# Patient Record
Sex: Male | Born: 1945 | Race: White | Hispanic: No | Marital: Married | Smoking: Never smoker
Health system: Southern US, Community
[De-identification: ages and names within clinical notes are randomized; demographics above are authoritative.]

## PROBLEM LIST (undated history)

## (undated) DIAGNOSIS — R918 Other nonspecific abnormal finding of lung field: Secondary | ICD-10-CM

## (undated) DIAGNOSIS — I2510 Atherosclerotic heart disease of native coronary artery without angina pectoris: Secondary | ICD-10-CM

## (undated) DIAGNOSIS — H9190 Unspecified hearing loss, unspecified ear: Secondary | ICD-10-CM

## (undated) DIAGNOSIS — K219 Gastro-esophageal reflux disease without esophagitis: Secondary | ICD-10-CM

## (undated) DIAGNOSIS — E785 Hyperlipidemia, unspecified: Secondary | ICD-10-CM

## (undated) DIAGNOSIS — M109 Gout, unspecified: Secondary | ICD-10-CM

## (undated) DIAGNOSIS — I219 Acute myocardial infarction, unspecified: Secondary | ICD-10-CM

## (undated) DIAGNOSIS — R42 Dizziness and giddiness: Secondary | ICD-10-CM

## (undated) DIAGNOSIS — I10 Essential (primary) hypertension: Secondary | ICD-10-CM

## (undated) HISTORY — PX: APPENDECTOMY: SHX54

## (undated) HISTORY — PX: OTHER SURGICAL HISTORY: SHX169

## (undated) HISTORY — PX: CHOLECYSTECTOMY: SHX55

## (undated) HISTORY — PX: CARDIAC CATHETERIZATION: SHX172

## (undated) HISTORY — PX: CORONARY ARTERY BYPASS GRAFT: SHX141

---

## 2005-01-15 ENCOUNTER — Encounter: Payer: Self-pay | Admitting: Internal Medicine

## 2005-01-24 ENCOUNTER — Encounter: Payer: Self-pay | Admitting: Internal Medicine

## 2005-05-24 ENCOUNTER — Emergency Department: Payer: Self-pay | Admitting: Emergency Medicine

## 2005-05-24 ENCOUNTER — Other Ambulatory Visit: Payer: Self-pay

## 2005-09-22 ENCOUNTER — Ambulatory Visit: Payer: Self-pay | Admitting: Gastroenterology

## 2006-01-13 ENCOUNTER — Ambulatory Visit: Payer: Self-pay | Admitting: Internal Medicine

## 2006-01-29 ENCOUNTER — Inpatient Hospital Stay (HOSPITAL_COMMUNITY): Admission: RE | Admit: 2006-01-29 | Discharge: 2006-01-30 | Payer: Self-pay | Admitting: Neurosurgery

## 2006-06-10 ENCOUNTER — Encounter: Payer: Self-pay | Admitting: Podiatry

## 2006-06-16 ENCOUNTER — Other Ambulatory Visit: Payer: Self-pay

## 2006-06-16 ENCOUNTER — Emergency Department: Payer: Self-pay | Admitting: Internal Medicine

## 2006-06-26 ENCOUNTER — Encounter: Payer: Self-pay | Admitting: Podiatry

## 2006-08-12 ENCOUNTER — Encounter: Payer: Self-pay | Admitting: Cardiology

## 2006-08-25 ENCOUNTER — Encounter: Payer: Self-pay | Admitting: Cardiology

## 2006-09-24 ENCOUNTER — Encounter: Payer: Self-pay | Admitting: Cardiology

## 2006-10-25 ENCOUNTER — Encounter: Payer: Self-pay | Admitting: Cardiology

## 2006-11-24 ENCOUNTER — Encounter: Payer: Self-pay | Admitting: Cardiology

## 2006-12-28 ENCOUNTER — Encounter: Payer: Self-pay | Admitting: Internal Medicine

## 2007-01-25 ENCOUNTER — Encounter: Payer: Self-pay | Admitting: Internal Medicine

## 2011-10-31 ENCOUNTER — Ambulatory Visit: Payer: Self-pay | Admitting: Gastroenterology

## 2014-02-07 ENCOUNTER — Emergency Department: Payer: Self-pay | Admitting: Emergency Medicine

## 2014-02-07 LAB — CBC WITH DIFFERENTIAL/PLATELET
BASOS ABS: 0.1 10*3/uL (ref 0.0–0.1)
BASOS PCT: 1.1 %
EOS ABS: 0.1 10*3/uL (ref 0.0–0.7)
Eosinophil %: 1 %
HCT: 51.4 % (ref 40.0–52.0)
HGB: 17.8 g/dL (ref 13.0–18.0)
Lymphocyte #: 2.1 10*3/uL (ref 1.0–3.6)
Lymphocyte %: 21.4 %
MCH: 29 pg (ref 26.0–34.0)
MCHC: 34.7 g/dL (ref 32.0–36.0)
MCV: 84 fL (ref 80–100)
Monocyte #: 1 x10 3/mm (ref 0.2–1.0)
Monocyte %: 10.5 %
NEUTROS PCT: 66 %
Neutrophil #: 6.5 10*3/uL (ref 1.4–6.5)
PLATELETS: 160 10*3/uL (ref 150–440)
RBC: 6.15 10*6/uL — AB (ref 4.40–5.90)
RDW: 14.3 % (ref 11.5–14.5)
WBC: 9.9 10*3/uL (ref 3.8–10.6)

## 2014-02-07 LAB — COMPREHENSIVE METABOLIC PANEL
ALBUMIN: 3.8 g/dL (ref 3.4–5.0)
ALK PHOS: 72 U/L
ALT: 29 U/L
ANION GAP: 12 (ref 7–16)
BILIRUBIN TOTAL: 1.1 mg/dL — AB (ref 0.2–1.0)
BUN: 13 mg/dL (ref 7–18)
CALCIUM: 8.8 mg/dL (ref 8.5–10.1)
CHLORIDE: 106 mmol/L (ref 98–107)
Co2: 21 mmol/L (ref 21–32)
Creatinine: 0.85 mg/dL (ref 0.60–1.30)
EGFR (African American): >60
EGFR (Non-African Amer.): >60
Glucose: 122 mg/dL — ABNORMAL HIGH (ref 65–99)
Osmolality: 279 (ref 275–301)
POTASSIUM: 3.4 mmol/L — AB (ref 3.5–5.1)
SGOT(AST): 34 U/L (ref 15–37)
Sodium: 139 mmol/L (ref 136–145)
Total Protein: 6.9 g/dL (ref 6.4–8.2)

## 2014-02-07 LAB — TROPONIN I: Troponin-I: <0.02 ng/mL

## 2014-08-25 ENCOUNTER — Ambulatory Visit
Admit: 2014-08-25 | Disposition: A | Discharge status: Home / Self Care | Payer: Self-pay | Attending: Internal Medicine | Admitting: Internal Medicine

## 2014-08-25 LAB — CBC CANCER CENTER
BASOS PCT: 1 %
Basophil #: 0.1 x10 3/mm (ref 0.0–0.1)
EOS PCT: 0.4 %
Eosinophil #: 0 x10 3/mm (ref 0.0–0.7)
HCT: 50.5 % (ref 40.0–52.0)
HGB: 17.8 g/dL (ref 13.0–18.0)
LYMPHS ABS: 1.3 x10 3/mm (ref 1.0–3.6)
Lymphocyte %: 13.7 %
MCH: 29.6 pg (ref 26.0–34.0)
MCHC: 35.2 g/dL (ref 32.0–36.0)
MCV: 84 fL (ref 80–100)
MONO ABS: 0.9 x10 3/mm (ref 0.2–1.0)
Monocyte %: 9.3 %
NEUTROS PCT: 75.6 %
Neutrophil #: 7 x10 3/mm — ABNORMAL HIGH (ref 1.4–6.5)
Platelet: 164 x10 3/mm (ref 150–440)
RBC: 6 10*6/uL — ABNORMAL HIGH (ref 4.40–5.90)
RDW: 14.6 % — AB (ref 11.5–14.5)
WBC: 9.2 x10 3/mm (ref 3.8–10.6)

## 2014-08-25 LAB — IRON AND TIBC
IRON SATURATION: 26.3
Iron Bind.Cap.(Total): 388 (ref 250–450)
Iron: 102 ug/dL
UNBOUND IRON-BIND. CAP.: 285.9

## 2014-08-25 LAB — FERRITIN: FERRITIN (ARMC): 99 ng/mL

## 2014-08-30 LAB — CBC CANCER CENTER
BASOS PCT: 0.9 %
Basophil #: 0.1 x10 3/mm (ref 0.0–0.1)
EOS PCT: 1.1 %
Eosinophil #: 0.1 x10 3/mm (ref 0.0–0.7)
HCT: 50.4 % (ref 40.0–52.0)
HGB: 17.5 g/dL (ref 13.0–18.0)
LYMPHS ABS: 1.4 x10 3/mm (ref 1.0–3.6)
Lymphocyte %: 17.5 %
MCH: 29.5 pg (ref 26.0–34.0)
MCHC: 34.7 g/dL (ref 32.0–36.0)
MCV: 85 fL (ref 80–100)
Monocyte #: 0.8 x10 3/mm (ref 0.2–1.0)
Monocyte %: 9.5 %
NEUTROS ABS: 5.6 x10 3/mm (ref 1.4–6.5)
NEUTROS PCT: 71 %
PLATELETS: 147 x10 3/mm — AB (ref 150–440)
RBC: 5.93 10*6/uL — ABNORMAL HIGH (ref 4.40–5.90)
RDW: 14.8 % — AB (ref 11.5–14.5)
WBC: 7.9 x10 3/mm (ref 3.8–10.6)

## 2015-01-08 ENCOUNTER — Other Ambulatory Visit: Payer: Self-pay | Admitting: Family Medicine

## 2015-01-09 ENCOUNTER — Telehealth: Payer: Self-pay | Admitting: *Deleted

## 2015-01-09 ENCOUNTER — Encounter: Payer: Self-pay | Admitting: *Deleted

## 2015-01-09 NOTE — Telephone Encounter (Signed)
Letter of release and Dr. Paula Compton progress note from 08/30/2014 faxed to Dr. Bethann Punches with [facility].Marland KitchenMarland Kitchen

## 2015-02-02 ENCOUNTER — Encounter: Payer: Self-pay | Admitting: *Deleted

## 2015-02-05 ENCOUNTER — Ambulatory Visit
Admission: RE | Admit: 2015-02-05 | Discharge: 2015-02-05 | Disposition: A | Discharge status: Home / Self Care | Payer: Medicare Other | Source: Ambulatory Visit | Attending: Gastroenterology | Admitting: Gastroenterology

## 2015-02-05 ENCOUNTER — Encounter
Admission: RE | Disposition: A | Discharge status: Home / Self Care | Payer: Self-pay | Source: Ambulatory Visit | Attending: Gastroenterology

## 2015-02-05 ENCOUNTER — Encounter: Payer: Self-pay | Admitting: *Deleted

## 2015-02-05 ENCOUNTER — Ambulatory Visit: Payer: Medicare Other | Admitting: Anesthesiology

## 2015-02-05 DIAGNOSIS — Z888 Allergy status to other drugs, medicaments and biological substances status: Secondary | ICD-10-CM | POA: Diagnosis not present

## 2015-02-05 DIAGNOSIS — K449 Diaphragmatic hernia without obstruction or gangrene: Secondary | ICD-10-CM | POA: Insufficient documentation

## 2015-02-05 DIAGNOSIS — Z7982 Long term (current) use of aspirin: Secondary | ICD-10-CM | POA: Diagnosis not present

## 2015-02-05 DIAGNOSIS — I252 Old myocardial infarction: Secondary | ICD-10-CM | POA: Diagnosis not present

## 2015-02-05 DIAGNOSIS — K210 Gastro-esophageal reflux disease with esophagitis: Secondary | ICD-10-CM | POA: Diagnosis not present

## 2015-02-05 DIAGNOSIS — K2960 Other gastritis without bleeding: Secondary | ICD-10-CM | POA: Diagnosis present

## 2015-02-05 DIAGNOSIS — F410 Panic disorder [episodic paroxysmal anxiety] without agoraphobia: Secondary | ICD-10-CM | POA: Insufficient documentation

## 2015-02-05 DIAGNOSIS — I10 Essential (primary) hypertension: Secondary | ICD-10-CM | POA: Diagnosis not present

## 2015-02-05 DIAGNOSIS — K317 Polyp of stomach and duodenum: Secondary | ICD-10-CM | POA: Insufficient documentation

## 2015-02-05 DIAGNOSIS — M109 Gout, unspecified: Secondary | ICD-10-CM | POA: Diagnosis not present

## 2015-02-05 DIAGNOSIS — E785 Hyperlipidemia, unspecified: Secondary | ICD-10-CM | POA: Diagnosis not present

## 2015-02-05 DIAGNOSIS — Z79899 Other long term (current) drug therapy: Secondary | ICD-10-CM | POA: Diagnosis not present

## 2015-02-05 DIAGNOSIS — R05 Cough: Secondary | ICD-10-CM | POA: Diagnosis not present

## 2015-02-05 DIAGNOSIS — I2510 Atherosclerotic heart disease of native coronary artery without angina pectoris: Secondary | ICD-10-CM | POA: Insufficient documentation

## 2015-02-05 HISTORY — DX: Atherosclerotic heart disease of native coronary artery without angina pectoris: I25.10

## 2015-02-05 HISTORY — DX: Gout, unspecified: M10.9

## 2015-02-05 HISTORY — DX: Hyperlipidemia, unspecified: E78.5

## 2015-02-05 HISTORY — DX: Other nonspecific abnormal finding of lung field: R91.8

## 2015-02-05 HISTORY — DX: Essential (primary) hypertension: I10

## 2015-02-05 HISTORY — PX: ESOPHAGOGASTRODUODENOSCOPY (EGD) WITH PROPOFOL: SHX5813

## 2015-02-05 HISTORY — DX: Acute myocardial infarction, unspecified: I21.9

## 2015-02-05 SURGERY — ESOPHAGOGASTRODUODENOSCOPY (EGD) WITH PROPOFOL
Anesthesia: General

## 2015-02-05 MED ORDER — PROPOFOL 10 MG/ML IV BOLUS
INTRAVENOUS | Status: DC | PRN
  Administered 2015-02-05: 70 mg via INTRAVENOUS
  Administered 2015-02-05: 30 mg via INTRAVENOUS

## 2015-02-05 MED ORDER — GLYCOPYRROLATE 0.2 MG/ML IJ SOLN
INTRAMUSCULAR | Status: DC | PRN
  Administered 2015-02-05: 0.2 mg via INTRAVENOUS

## 2015-02-05 MED ORDER — PROPOFOL INFUSION 10 MG/ML OPTIME
INTRAVENOUS | Status: DC | PRN
  Administered 2015-02-05: 120 ug/kg/min via INTRAVENOUS

## 2015-02-05 MED ORDER — BUTAMBEN-TETRACAINE-BENZOCAINE 2-2-14 % EX AERO
INHALATION_SPRAY | CUTANEOUS | Status: DC | PRN
  Administered 2015-02-05: 1 via TOPICAL

## 2015-02-05 MED ORDER — LIDOCAINE HCL (CARDIAC) 20 MG/ML IV SOLN
INTRAVENOUS | Status: DC | PRN
  Administered 2015-02-05: 50 mg via INTRAVENOUS

## 2015-02-05 MED ORDER — SODIUM CHLORIDE 0.9 % IV SOLN
INTRAVENOUS | Status: DC
Start: 2015-02-05 — End: 2015-02-05
  Administered 2015-02-05: 15:00:00 via INTRAVENOUS

## 2015-02-05 NOTE — H&P (Signed)
Outpatient short stay form Pre-procedure 02/05/2015 3:56 PM Bryan Deem MD  Primary Physician: Dr. Bethann Punches  Reason for visit:  GERD, EGD  History of present illness:  Patient is a 69 year old male presenting history gas after reflux. This seemed to got worse after having coughing related to the use of a Ace inhibitor. The coughing has improved since his Cipro was discontinued however he has continued to have some issues with reflux recently placed on a dose of 2 receptor antagonist in addition to his PPI. Patient has no dysphagia, there is some occasional dyspepsia. Patient also has a history of a nodule in the upper esophagus being removed on his last procedure. 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  Patient does take aspirin and Plavix however he had discontinued those 5 days ago.    Current facility-administered medications:  .  0.9 %  sodium chloride infusion, , Intravenous, Continuous, Bryan Deem, MD, Last Rate: 20 mL/hr at 02/05/15 1502  Prescriptions prior to admission  Medication Sig Dispense Refill Last Dose  . acetaminophen (TYLENOL) 500 MG tablet Take 500 mg by mouth every 6 (six) hours as needed.   Past Month at Unknown time  . ALPRAZolam (XANAX) 0.5 MG tablet Take 0.5 mg by mouth at bedtime as needed for anxiety.   Past Month at Unknown time  . amLODipine (NORVASC) 5 MG tablet Take 5 mg by mouth 2 (two) times daily.   02/05/2015 at 0600  . aspirin EC 81 MG tablet Take 81 mg by mouth daily.   Past Week at Unknown time  . clopidogrel (PLAVIX) 75 MG tablet Take 75 mg by mouth daily.   Past Week at Unknown time  . colchicine 0.6 MG tablet Take 0.6 mg by mouth 3 (three) times a week.   Past Week at Unknown time  . losartan (COZAAR) 100 MG tablet Take 100 mg by mouth daily.   02/05/2015 at 0600  . magnesium 30 MG tablet Take 30 mg by mouth 2 (two) times daily.   02/05/2015 at 0600  . metoprolol succinate (TOPROL-XL) 50  MG 24 hr tablet Take 50 mg by mouth daily. Take with or immediately following a meal.   02/05/2015 at 0600  . pantoprazole (PROTONIX) 40 MG tablet Take 40 mg by mouth daily.   02/04/2015 at Unknown time  . ranitidine (ZANTAC) 300 MG tablet Take 300 mg by mouth at bedtime.   02/04/2015 at Unknown time  . simvastatin (ZOCOR) 20 MG tablet Take 20 mg by mouth daily at 6 PM.   02/04/2015 at Unknown time  . nitroGLYCERIN (NITROSTAT) 0.4 MG SL tablet Place 0.4 mg under the tongue every 5 (five) minutes as needed for chest pain.   Not Taking at Unknown time  . vardenafil (LEVITRA) 10 MG tablet Take 10 mg by mouth as needed for erectile dysfunction.   Not Taking at Unknown time     Allergies  Allergen Reactions  . Ace Inhibitors   . Cardizem [Diltiazem Hcl]   . Prilosec Otc [Omeprazole Magnesium]   . Verapamil Hcl Er      Past Medical History  Diagnosis Date  . Coronary artery disease   . Myocardial infarction   . Gout   . Pulmonary nodules   . Hypertension   . Hyperlipidemia   . ASCVD (arteriosclerotic cardiovascular disease)     Review of systems:      Physical Exam    Heart and lungs: Regular rate  and rhythm without rub or gallop, lungs are bilaterally clear    HEENT: Normocephalic atraumatic eyes are anicteric    Other:     Pertinant exam for procedure: Soft nontender nondistended bowel sounds positive normoactive    Planned proceedures: EGD with indicated procedures. I have discussed the risks benefits and complications of procedures to include not limited to bleeding, infection, perforation and the risk of sedation and the patient wishes to proceed.    Bryan Deem, MD Gastroenterology 02/05/2015  3:56 PM

## 2015-02-05 NOTE — Anesthesia Preprocedure Evaluation (Addendum)
Anesthesia Evaluation  Patient identified by MRN, date of birth, ID band Patient awake    Reviewed: Allergy & Precautions, NPO status , Patient's Chart, lab work & pertinent test results, reviewed documented beta blocker date and time   Airway Mallampati: II  TM Distance: >3 FB     Dental  (+) Chipped   Pulmonary           Cardiovascular hypertension, Pt. on medications and Pt. on home beta blockers + CAD and + Past MI       Neuro/Psych    GI/Hepatic   Endo/Other    Renal/GU      Musculoskeletal   Abdominal   Peds  Hematology   Anesthesia Other Findings MI. CABG 2008. 1996 stent. Cervical fusion 2007. Stress echo 2 weeks ago normal. Has hx of panic attacks.  Reproductive/Obstetrics                            Anesthesia Physical Anesthesia Plan  ASA: III  Anesthesia Plan: General   Post-op Pain Management:    Induction:   Airway Management Planned: Nasal Cannula  Additional Equipment:   Intra-op Plan:   Post-operative Plan:   Informed Consent: I have reviewed the patients History and Physical, chart, labs and discussed the procedure including the risks, benefits and alternatives for the proposed anesthesia with the patient or authorized representative who has indicated his/her understanding and acceptance.     Plan Discussed with: CRNA  Anesthesia Plan Comments:         Anesthesia Quick Evaluation

## 2015-02-05 NOTE — Transfer of Care (Signed)
Immediate Anesthesia Transfer of Care Note  Patient: Bryan Leonard  Procedure(s) Performed: Procedure(s): ESOPHAGOGASTRODUODENOSCOPY (EGD) WITH PROPOFOL (N/A)  Patient Location: Endoscopy Unit  Anesthesia Type:General  Level of Consciousness: awake  Airway & Oxygen Therapy: Patient Spontanous Breathing and Patient connected to nasal cannula oxygen  Post-op Assessment: Report given to RN  Post vital signs: Reviewed  Last Vitals:  Filed Vitals:   02/05/15 1635  BP: 92/45  Pulse: 89  Temp: 36.1 C  Resp: 18    Complications: No apparent anesthesia complications

## 2015-02-05 NOTE — Op Note (Signed)
[facility] Gastroenterology Patient Name: Bryan Leonard Procedure Date: 02/05/2015 4:00 PM MRN: [id number] Account #: [id number] Date of Birth: [birth year] Admit Type: Outpatient Age: 69 Room: [facility] ENDO ROOM 3 Gender: Male Note Status: Finalized Procedure:         Upper GI endoscopy Indications:       Gastro-esophageal reflux disease, Esophageal reflux                     symptoms that persist despite appropriate therapy Providers:         Christena Deem, MD Referring MD:      Danella Penton, MD (Referring MD) Medicines:         Monitored Anesthesia Care Complications:     No immediate complications. Procedure:         Pre-Anesthesia Assessment:                    - ASA Grade Assessment: III - A patient with severe                     systemic disease.                    After obtaining informed consent, the endoscope was passed                     under direct vision. Throughout the procedure, the                     patient's blood pressure, pulse, and oxygen saturations                     were monitored continuously. The Olympus GIF-160 endoscope                     (S#. [id number]) was introduced through the mouth, and                     advanced to the third part of duodenum. The upper GI                     endoscopy was accomplished without difficulty. The patient                     tolerated the procedure. Findings:      The Z-line was irregular. Biopsies were taken with a cold forceps for       histology.      A small hiatus hernia was found. The Z-line was a variable distance from       incisors; the hiatal hernia was sliding.      LA Grade B (one or more mucosal breaks greater than 5 mm, not extending       between the tops of two mucosal folds) esophagitis with no bleeding was       found.      Scattered mild inflammation characterized by congestion (edema),       erosions and erythema was found on the greater curvature of the gastric   body. Biopsies were taken with a cold forceps for histology. Biopsies       were taken with a cold forceps for Helicobacter pylori testing.      The cardia and gastric fundus were normal on retroflexion otherwise.      The examined duodenum was normal.  Multiple 1 to 4 mm pedunculated and sessile polyps with no bleeding and       no stigmata of recent bleeding were found in the gastric body. Biopsies       were taken with a cold forceps for histology. Impression:        - Z-line irregular. Biopsied.                    - Small hiatus hernia.                    - LA Grade B erosive esophagitis.                    - Erosive gastritis. Biopsied.                    - Normal examined duodenum. Recommendation:    - Use Protonix (pantoprazole) 40 mg PO BID daily.                    - Await pathology results. Procedure Code(s): --- Professional ---                    [id number], Esophagogastroduodenoscopy, flexible, transoral;                     with biopsy, single or multiple Diagnosis Code(s): --- Professional ---                    530.89, Other specified disorders of esophagus                    530.19, Other esophagitis                    535.40, Other specified gastritis, without mention of                     hemorrhage                    530.81, Esophageal reflux                    553.3, Diaphragmatic hernia without mention of obstruction                     or gangrene CPT copyright 2014 American Medical Association. All rights reserved. The codes documented in this report are preliminary and upon coder review may  be revised to meet current compliance requirements. Christena Deem, MD 02/05/2015 4:34:39 PM This report has been signed electronically. Number of Addenda: 0 Note Initiated On: 02/05/2015 4:00 PM      [facility]

## 2015-02-06 ENCOUNTER — Encounter: Payer: Self-pay | Admitting: Gastroenterology

## 2015-02-07 LAB — SURGICAL PATHOLOGY

## 2015-02-09 NOTE — Anesthesia Postprocedure Evaluation (Signed)
  Anesthesia Post-op Note  Patient: Bryan Leonard  Procedure(s) Performed: Procedure(s): ESOPHAGOGASTRODUODENOSCOPY (EGD) WITH PROPOFOL (N/A)  Anesthesia type:General  Patient location: PACU  Post pain: Pain level controlled  Post assessment: Post-op Vital signs reviewed, Patient's Cardiovascular Status Stable, Respiratory Function Stable, Patent Airway and No signs of Nausea or vomiting  Post vital signs: Reviewed and stable  Last Vitals:  Filed Vitals:   02/05/15 1720  BP:   Pulse: 70  Temp:   Resp: 14    Level of consciousness: awake, alert  and patient cooperative  Complications: No apparent anesthesia complications

## 2015-04-24 ENCOUNTER — Ambulatory Visit: Payer: Medicare Other | Attending: Internal Medicine

## 2015-04-24 VITALS — BP 128/65 | HR 71

## 2015-04-24 DIAGNOSIS — M25551 Pain in right hip: Secondary | ICD-10-CM | POA: Diagnosis present

## 2015-04-24 DIAGNOSIS — R531 Weakness: Secondary | ICD-10-CM | POA: Insufficient documentation

## 2015-04-24 DIAGNOSIS — R262 Difficulty in walking, not elsewhere classified: Secondary | ICD-10-CM | POA: Diagnosis present

## 2015-04-24 NOTE — Therapy (Signed)
[place] [facility] [address] [place], [address] Phone: [phone]   Fax:  [phone]  Physical Therapy Evaluation  Patient Details  Name: Bryan Leonard MRN: [id number] Date of Birth: [birth year] Referring Provider: Bethann Punches, MD   Encounter Date: 04/24/2015      PT End of Session - 04/24/15 1437    Visit Number 1   Number of Visits 9   Date for PT Re-Evaluation 05/24/15   Authorization Type 1   Authorization Time Period of 10   PT Start Time 1435   PT Stop Time 1555   PT Time Calculation (min) 80 min      Past Medical History  Diagnosis Date  . Coronary artery disease   . Myocardial infarction (HCC)   . Gout   . Pulmonary nodules   . Hypertension   . Hyperlipidemia   . ASCVD (arteriosclerotic cardiovascular disease)     Past Surgical History  Procedure Laterality Date  . Appendectomy    . Cholecystectomy    . Coronary artery bypass graft    . Pseudoaneursym Right     right groin  . Cardiac catheterization    . Esophagogastroduodenoscopy (egd) with propofol N/A 02/05/2015    Procedure: ESOPHAGOGASTRODUODENOSCOPY (EGD) WITH PROPOFOL;  Surgeon: Christena Deem, MD;  Location[facility] ENDOSCOPY;  Service: Endoscopy;  Laterality: N/A;    Filed Vitals:   04/24/15 1446  BP: 128/65  Pulse: 71    Visit Diagnosis:  Right hip pain - Plan: PT plan of care cert/re-cert  Difficulty walking - Plan: PT plan of care cert/re-cert  Weakness - Plan: PT plan of care cert/re-cert      Subjective Assessment - 04/24/15 1446    Subjective R hip pain: 0/10 (pt currenlty sitting), 5/10 at most for the past 2 weeks (when pt was doing volunteer work at [employer])   Pertinent History R hip pain began June 2016 gradual onset. No falls, or known mechanism of injury. Pt states difficulty stepping up with his R LE to get up the steps due to pain and weakness. December 29, 2014 Dr. Hyacinth Meeker gave pt a cortisone shot for  his R hip which helped. Pt also adds prior to June 2016, pt had a cortisone shot in low back due to pain (around March or April 2016). Pt was diagnosed with bursitis in his R hip.  Had an X-ray for low back which revealed a little arthritis but nothing significant. 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 On 04/09/2015 did some clean up work for church which increased R hip pain causing pt to limp.  Pt states R hip and lateral thigh bruised. Hand and X-ray for his R hip which was negative for abnormality. Was prescrbed diclofenac for 5 days which helped the pain a little.   Pt states he did not feel an impact, or popping for his R hip.  Difficulty crossing his R leg over his left.    Patient Stated Goals "I'd like to make it (hip pain) go away. "   Currently in Pain? Yes   Pain Score --  please see subjective   Pain Location Hip   Pain Orientation Right   Pain Descriptors / Indicators Aching   Aggravating Factors  going up and down steps, donning socks   Pain Relieving Factors ice and heat   Multiple Pain Sites No  Objectives:   There-ex  Directed patient with prone glue max/quad set 10x2 with 5 second holds each LE (no pain) Supine hooklying hip adduction pillow squeeze 10x2 with 5 seconds (some R posterior hip tightness which decreased with less exercise effort)  Improved exercise technique, movement at target joints, use of target muscles after mod verbal, visual, tactile cues.           [facility] Assessment - 04/24/15 1452    Assessment   Medical Diagnosis  R hip pain   Referring Provider Bethann PunchesMark Miller, MD    Onset Date/Surgical Date 04/11/15  Date MD order signed   Prior Therapy No known physical therapy for current condition   Precautions   Precaution Comments No known precautions   Restrictions   Other Position/Activity Restrictions No known restrictions   Balance Screen   Has the patient  fallen in the past 6 months No   Has the patient had a decrease in activity level because of a fear of falling?  No   Is the patient reluctant to leave their home because of a fear of falling?  No   Prior Function   Vocation Retired   GafferVocation Requirements PLOF: better able to negotiate stairs, don and doff socks and shoes   Observation/Other Assessments   Observations Bruising R lateral thigh proximal half and brusing R posterior knee. Slump test revealed neural tension R hip sciatic nerve (part of pt hip pain). (+) Ober's test R LE. (-) S/L piriformis test R hip   Lower Extremity Functional Scale  73/80   Posture/Postural Control   Posture Comments R hip in ER, L lumbar side bend  L5-L1, R side bend from T12 and up, R iliac crest higher, R knee slightly higher, bilateral foot pronation, decreased lumbar lordosis.    AROM   Lumbar Flexion WFL with bilateral LE tightness   Lumbar Extension limited lumbar and thoracic extension   Lumbar - Right Side Bend Limited with R low back discomfort   Lumbar - Left Side Bend limited    Lumbar - Right Rotation WFL   Lumbar - Left Rotation WFL   Strength   Overall Strength Comments Reproduction or R lateral hip pain with eccentric hip abduction   Right Hip Flexion 4-/5  with R posterior hip stress   Right Hip Extension 4-/5   Right Hip External Rotation  4-/5   Right Hip Internal Rotation 4-/5  with slight R posterior hip discomfort   Right Hip ABduction 4/5  with reproduction of symptoms   Left Hip Flexion 4-/5   Left Hip Extension 4-/5   Left Hip External Rotation 4-/5   Left Hip Internal Rotation 4-/5   Left Hip ABduction 4/5   Right Knee Flexion 4/5   Right Knee Extension 5/5   Left Knee Flexion 4/5   Left Knee Extension 5/5   Palpation   Palpation comment TTP R lateral hip, proximal to bruise. TTP R anterior lateral greater trochanter.  TTP proximal attachement R hamstrings and proximal sciatic nerve.    Ambulation/Gait   Gait Comments  Antalgic, decreased stance R LE with R lateral lean     TTP Proximal attachement of R hamstrings with reprduction of symptoms especially with resisted knee flexion                      PT Education - 04/24/15 1912    Education provided Yes   Education Details ther-ex, ice, plan of care  Person(s) Educated Patient   Methods Explanation;Demonstration;Tactile cues;Verbal cues   Comprehension Verbalized understanding;Returned demonstration             PT Long Term Goals - May 21, 2015 1913    PT LONG TERM GOAL #1   Title Patient will have a decrease in R hip pain to 3/10 or less at worst to promote ability to ambulate, negotiate stairs, don and doff socks/shoes.   Time 4   Period Weeks   Status New   PT LONG TERM GOAL #2   Title Patient will improve bilateral hip abduction and glute max strength by at least 1/2 MMT grade to promote ability to ambulate with less pain in R hip.    Time 4   Period Weeks   Status New   PT LONG TERM GOAL #3   Title Patient will be able to ascend and descend 6 regular step with bilateral UE assist in a reciprocal pattern with 3/10 or less R hip pain to promote ability to enter and exit his home.    Time 4   Period Weeks   Status New               Plan - May 21, 2015 1906    Clinical Impression Statement Patient is a 69 year old male who came to physical therapy secondary to R hip pain. He also presents with bruising lateral thigh and posterior knee, TTP to R anterior lateral greater trochanter, posterior hip (proximal attachment of hamstrings and proximal sciatic nerve), neural tension, bilateral hip weakness, altered gait pattern and posture, and difficulty performing functional tasks such as negotiating stairs, donning and doffing socks/shoes, walking and maintaining positions such as laying on his R side. Patient will benefit from skilled physical therapy services to address the aforementioned deficits.    Pt will benefit from  skilled therapeutic intervention in order to improve on the following deficits Decreased strength;Pain;Difficulty walking   Rehab Potential Good   Clinical Impairments Affecting Rehab Potential age   PT Frequency 2x / week   PT Duration 4 weeks   PT Treatment/Interventions Therapeutic exercise;Manual techniques;Therapeutic activities;Iontophoresis /ml Dexamethasone;Electrical Stimulation;Patient/family education;Ultrasound;Neuromuscular re-education   PT Next Visit Plan gentle hip strengthenthening, manual therapy   Consulted and Agree with Plan of Care Patient          G-Codes - 05-21-2015 1919    Functional Assessment Tool Used LEFS, patient interview, clinical presentation, clinical judgement   Functional Limitation Mobility: Walking and moving around   Mobility: Walking and Moving Around Current Status (W0981) At least 20 percent but less than 40 percent impaired, limited or restricted   Mobility: Walking and Moving Around Goal Status [id number]) At least 1 percent but less than 20 percent impaired, limited or restricted       Problem List There are no active problems to display for this patient.  Thank you for your referral.   Loralyn Freshwater PT, DPT   May 21, 2015, 7:27 PM  [place] [place] [facility] PH[facility] SPORTS MEDICINE [address] [place], [address] Phone: [phone]   Fax:  [phone]  Name: LEM PEARY MRN: [id number] Date of Birth: Jul 01, 1945

## 2015-04-24 NOTE — Patient Instructions (Signed)
Patient was recommended to use ice to R hip x 15 min 5x daily to help decrease the bruising. Pt verbalized understanding.

## 2015-04-26 ENCOUNTER — Ambulatory Visit: Payer: Medicare Other | Attending: Internal Medicine

## 2015-04-26 DIAGNOSIS — R531 Weakness: Secondary | ICD-10-CM | POA: Diagnosis present

## 2015-04-26 DIAGNOSIS — R262 Difficulty in walking, not elsewhere classified: Secondary | ICD-10-CM | POA: Diagnosis present

## 2015-04-26 DIAGNOSIS — M25551 Pain in right hip: Secondary | ICD-10-CM | POA: Diagnosis not present

## 2015-04-26 NOTE — Therapy (Signed)
[facility]IC[facility] [address] [place], [address] Phone: [phone]   Fax:  [phone]  Physical Therapy Treatment  Patient Details  Name: Bryan Leonard MRN: [id number] Date of Birth: December 30, 1945 Referring Provider: Bethann Punches, MD   Encounter Date: 04/26/2015      PT End of Session - 04/26/15 0803    Visit Number 2   Number of Visits 9   Date for PT Re-Evaluation 05/24/15   Authorization Type 2   Authorization Time Period of 10   PT Start Time 0803   PT Stop Time 0856   PT Time Calculation (min) 53 min      Past Medical History  Diagnosis Date  . Coronary artery disease   . Myocardial infarction (HCC)   . Gout   . Pulmonary nodules   . Hypertension   . Hyperlipidemia   . ASCVD (arteriosclerotic cardiovascular disease)     Past Surgical History  Procedure Laterality Date  . Appendectomy    . Cholecystectomy    . Coronary artery bypass graft    . Pseudoaneursym Right     right groin  . Cardiac catheterization    . Esophagogastroduodenoscopy (egd) with propofol N/A 02/05/2015    Procedure: ESOPHAGOGASTRODUODENOSCOPY (EGD) WITH PROPOFOL;  Surgeon: Christena Deem, MD;  Location: [facility] ENDOSCOPY;  Service: Endoscopy;  Laterality: N/A;    There were no vitals filed for this visit.  Visit Diagnosis:  Right hip pain  Difficulty walking  Weakness      Subjective Assessment - 04/26/15 0804    Subjective Pt states that he has been icing his R hip, looks better. Still has some discomfort. 1/10 R hip discomfort currently.    Pertinent History R hip pain began June 2016 gradual onset. No falls, or known mechanism of injury. Pt states difficulty stepping up with his R LE to get up the steps due to pain and weakness. December 29, 2014 Dr. Hyacinth Meeker gave pt a cortisone shot for his R hip which helped. Pt also adds prior to June 2016, pt had a cortisone shot in low back due to pain (around March or April  2016). Pt was diagnosed with bursitis in his R hip.  Had an X-ray for low back which revealed a little arthritis but nothing significant. 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 On 04/09/2015 did some clean up work for church which increased R hip pain causing pt to limp.  Pt states R hip and lateral thigh bruised. Hand and X-ray for his R hip which was negative for abnormality. Was prescrbed diclofenac for 5 days which helped the pain a little.   Pt states he did not feel an impact, or popping for his R hip.  Difficulty crossing his R leg over his left.    Patient Stated Goals "I'd like to make it (hip pain) go away. "   Currently in Pain? Yes   Pain Score 1    Multiple Pain Sites No        Objectives:   There-ex  Directed patient with walking 32 ft x 2 with 5 lbs and 10 lbs weight on R side (to counterbalance R hip joint). No change in symptoms prone glue max/quad set 10x3 with 5 second holds each LE  Supine SLR hip flexion R 10x3 Supine hooklying hip adduction pillow squeeze 10x3 with 5 seconds Supine R knee  to chest with PT assist 30 seconds x 3 S/L R clam shell 10x3 Supine bilateral glute max extension isometrics (bridge position) 10x3 with 5 second holds Supine R hip extension (leg straight) isometrics 10x3 with 5 second holds Standing ball toss to trampoline firm surface 20x2, then standing on Air Ex pad 20x2 (to promote hip stability/control) Nustep (seat 8, level 4) x 5 min   Improved exercise technique, movement at target joints, use of target muscles after mod verbal, visual, tactile cues.     Posterior R hip pain (joint, sciatic area) in prone with ER. Decreased with increased repetition. Otherwise tolerated session well without aggravation of symptoms. Worked on gentle hip strengthening and stability exercises today to promote healing.                          PT Education  - 04/26/15 [phone]    Education provided Yes   Education Details ther-ex   Person(s) Educated Patient   Methods Explanation;Demonstration;Tactile cues;Verbal cues   Comprehension Verbalized understanding;Returned demonstration             PT Long Term Goals - 04/24/15 1913    PT LONG TERM GOAL #1   Title Patient will have a decrease in R hip pain to 3/10 or less at worst to promote ability to ambulate, negotiate stairs, don and doff socks/shoes.   Time 4   Period Weeks   Status New   PT LONG TERM GOAL #2   Title Patient will improve bilateral hip abduction and glute max strength by at least 1/2 MMT grade to promote ability to ambulate with less pain in R hip.    Time 4   Period Weeks   Status New   PT LONG TERM GOAL #3   Title Patient will be able to ascend and descend 6 regular step with bilateral UE assist in a reciprocal pattern with 3/10 or less R hip pain to promote ability to enter and exit his home.    Time 4   Period Weeks   Status New               Plan - 04/26/15 0802    Clinical Impression Statement Posterior R hip pain (joint, sciatic area) in prone with ER. Decreased with increased repetition. Otherwise tolerated session well without aggravation of symptoms. Worked on gentle hip strengthening and stability exercises today to promote healing   Pt will benefit from skilled therapeutic intervention in order to improve on the following deficits Decreased strength;Pain;Difficulty walking   Rehab Potential Good   Clinical Impairments Affecting Rehab Potential age   PT Frequency 2x / week   PT Duration 4 weeks   PT Treatment/Interventions Therapeutic exercise;Manual techniques;Therapeutic activities;Iontophoresis 4mg /ml Dexamethasone;Electrical Stimulation;Patient/family education;Ultrasound;Neuromuscular re-education   PT Next Visit Plan gentle hip strengthenthening, manual therapy   Consulted and Agree with Plan of Care Patient        Problem List There  are no active problems to display for this patient.  Loralyn FreshwaterMiguel Laygo PT, DPT   04/26/2015, 9:03 AM  [facility] [address] [place], [place], [address] Phone: [phone]   Fax:  [phone]  Name: Bryan Leonard MRN: [id number] Date of Birth: 03/25/1946

## 2015-04-26 NOTE — Patient Instructions (Addendum)
  On your back, keeping your right leg straight, and left knee bent: press your heel gently against the bed. Pain free level of effort. Hold for 5 seconds. Repeat 10 times, do 3 sets daily.

## 2015-05-01 ENCOUNTER — Ambulatory Visit: Payer: Medicare Other

## 2015-05-01 DIAGNOSIS — M25551 Pain in right hip: Secondary | ICD-10-CM | POA: Diagnosis not present

## 2015-05-01 DIAGNOSIS — R262 Difficulty in walking, not elsewhere classified: Secondary | ICD-10-CM

## 2015-05-01 DIAGNOSIS — R531 Weakness: Secondary | ICD-10-CM

## 2015-05-01 NOTE — Patient Instructions (Signed)
   Quadriceps Set (Prone)   With toes supporting lower legs, squeeze rear end muscles and tighten thigh muscles to straighten knees. Hold _5___ seconds. Relax. Repeat __10__ times per set. Do ___3_ sets per session. Do ___1_ sessions per day.  http://orth.exer.us/726   Copyright  VHI. All rights reserved.   

## 2015-05-01 NOTE — Therapy (Signed)
[facility] [address] [place], [address] Phone: [phone]   Fax:  [phone]  Physical Therapy Treatment  Patient Details  Name: Bryan Leonard MRN: [id number] Date of Birth: 1946-02-24 Referring Provider: Bethann Punches, MD   Encounter Date: 05/01/2015      PT End of Session - 05/01/15 1351    Visit Number 3   Number of Visits 9   Date for PT Re-Evaluation 05/24/15   Authorization Type 3   Authorization Time Period of 10   PT Start Time 1351   PT Stop Time 1439   PT Time Calculation (min) 48 min   Activity Tolerance Patient tolerated treatment well   Behavior During Therapy [facility] for tasks assessed/performed      Past Medical History  Diagnosis Date  . Coronary artery disease   . Myocardial infarction (HCC)   . Gout   . Pulmonary nodules   . Hypertension   . Hyperlipidemia   . ASCVD (arteriosclerotic cardiovascular disease)     Past Surgical History  Procedure Laterality Date  . Appendectomy    . Cholecystectomy    . Coronary artery bypass graft    . Pseudoaneursym Right     right groin  . Cardiac catheterization    . Esophagogastroduodenoscopy (egd) with propofol N/A 02/05/2015    Procedure: ESOPHAGOGASTRODUODENOSCOPY (EGD) WITH PROPOFOL;  Surgeon: Christena Deem, MD;  Location: [facility] ENDOSCOPY;  Service: Endoscopy;  Laterality: N/A;    There were no vitals filed for this visit.  Visit Diagnosis:  Right hip pain  Difficulty walking  Weakness      Subjective Assessment - 05/01/15 1352    Subjective Walking is easier. Getting up from a chair still bothers the back of his R hip. Still has difficulty lying on his R side without discomfort. Going up steps, R hip still feels weak.    Pertinent History R hip pain began June 2016 gradual onset. No falls, or known mechanism of injury. Pt states difficulty stepping up with his R LE to get up the steps due to pain and weakness. December 29, 2014 Dr. Hyacinth Meeker gave pt a cortisone shot for his R hip which helped. Pt also adds prior to June 2016, pt had a cortisone shot in low back due to pain (around March or April 2016). Pt was diagnosed with bursitis in his R hip.  Had an X-ray for low back which revealed a little arthritis but nothing significant. 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 On 04/09/2015 did some clean up work for church which increased R hip pain causing pt to limp.  Pt states R hip and lateral thigh bruised. Hand and X-ray for his R hip which was negative for abnormality. Was prescrbed diclofenac for 5 days which helped the pain a little.   Pt states he did not feel an impact, or popping for his R hip.  Difficulty crossing his R leg over his left.    Patient Stated Goals "I'd like to make it (hip pain) go away. "   Currently in Pain? Yes   Pain Score 2   getting up from chair; walking is 0-2/10   Multiple Pain Sites No         Objectives:   There-ex  Directed patient with Supine SLR hip flexion R 10x3, Supine hooklying hip adduction pillow squeeze 10x3 with 5 seconds  Supine bilateral glute max extension isometrics (bridge position) 10x3 with 5 second holds S/L R clam shell 10x3 (deep ache R hip felt afterwards) Supine R glute stretch (R knee to chest) 30 seconds x 5, prone glue max/quad set 10x3 with 5 second holds each LE  Total [employer] Squats incline 16 for 10x3,  Standing bilateral ankle DF/PF 2 min with bilateral UE assist,  Standing 2 kg ball toss to trampoline standing on Air Ex pad 20x3 (to promote hip stability/control) Forward step up with R LE onto AirEx pad with L UE assist 10x2 (slight discomfort R hip) Nustep (seat 8, level 4) x 5 min at end of session   Improved exercise technique, movement at target joints, use of target muscles after mod verbal, visual, tactile cues.    Pt tolerated session without aggravation of  symptoms. Slight R posterior hip discomfort with S/L R clam shell and forward step up onto Air Ex pad with R LE exercises which eased with rest.                               PT Education - 05/01/15 1359    Education provided Yes   Education Details ther-ex   Starwood HotelsPerson(s) Educated Patient   Methods Explanation;Demonstration;Tactile cues;Verbal cues   Comprehension Verbalized understanding;Returned demonstration             PT Long Term Goals - 04/24/15 1913    PT LONG TERM GOAL #1   Title Patient will have a decrease in R hip pain to 3/10 or less at worst to promote ability to ambulate, negotiate stairs, don and doff socks/shoes.   Time 4   Period Weeks   Status New   PT LONG TERM GOAL #2   Title Patient will improve bilateral hip abduction and glute max strength by at least 1/2 MMT grade to promote ability to ambulate with less pain in R hip.    Time 4   Period Weeks   Status New   PT LONG TERM GOAL #3   Title Patient will be able to ascend and descend 6 regular step with bilateral UE assist in a reciprocal pattern with 3/10 or less R hip pain to promote ability to enter and exit his home.    Time 4   Period Weeks   Status New               Plan - 05/01/15 1400    Clinical Impression Statement Pt tolerated session without aggravation of symptoms. Slight R posterior hip discomfort with S/L R clam shell and forward step up onto Air Ex pad with R LE exercises which eased with rest.    Pt will benefit from skilled therapeutic intervention in order to improve on the following deficits Decreased strength;Pain;Difficulty walking   Rehab Potential Good   Clinical Impairments Affecting Rehab Potential age   PT Frequency 2x / week   PT Duration 4 weeks   PT Treatment/Interventions Therapeutic exercise;Manual techniques;Therapeutic activities;Iontophoresis 4mg /ml Dexamethasone;Electrical Stimulation;Patient/family education;Ultrasound;Neuromuscular  re-education   PT Next Visit Plan gentle hip strengthenthening, manual therapy   Consulted and Agree with Plan of Care Patient        Problem List There are no active problems to display for this patient.   Loralyn FreshwaterMiguel Lesia Monica PT, DPT   05/01/2015, 2:44 PM  [facility] [address] [place], [place], [address] Phone: [phone]  Fax:  [phone]  Name: Bryan Leonard MRN: [id number] Date of Birth: 1945-06-10

## 2015-05-03 ENCOUNTER — Ambulatory Visit: Payer: Medicare Other

## 2015-05-03 DIAGNOSIS — M25551 Pain in right hip: Secondary | ICD-10-CM | POA: Diagnosis not present

## 2015-05-03 DIAGNOSIS — R262 Difficulty in walking, not elsewhere classified: Secondary | ICD-10-CM

## 2015-05-03 DIAGNOSIS — R531 Weakness: Secondary | ICD-10-CM

## 2015-05-03 NOTE — Therapy (Signed)
[facility] [address] [place], [place], [address] Phone: [phone]   Fax:  [phone]  Physical Therapy Treatment  Patient Details  Name: Bryan Leonard MRN: [id number] Date of Birth: 05/01/1946 Referring Provider: Bethann PunchesMark Miller, MD   Encounter Date: 05/03/2015      PT End of Session - 05/03/15 0814    Visit Number 4   Number of Visits 9   Date for PT Re-Evaluation 05/24/15   Authorization Type 4   Authorization Time Period of 10   PT Start Time 0815   PT Stop Time 0902   PT Time Calculation (min) 47 min   Activity Tolerance Patient tolerated treatment well   Behavior During Therapy [facility] for tasks assessed/performed      Past Medical History  Diagnosis Date  . Coronary artery disease   . Myocardial infarction (HCC)   . Gout   . Pulmonary nodules   . Hypertension   . Hyperlipidemia   . ASCVD (arteriosclerotic cardiovascular disease)     Past Surgical History  Procedure Laterality Date  . Appendectomy    . Cholecystectomy    . Coronary artery bypass graft    . Pseudoaneursym Right     right groin  . Cardiac catheterization    . Esophagogastroduodenoscopy (egd) with propofol N/A 02/05/2015    Procedure: ESOPHAGOGASTRODUODENOSCOPY (EGD) WITH PROPOFOL;  Surgeon: Christena DeemMartin U Skulskie, MD;  [facility];  Service: Endoscopy;  Laterality: N/A;    There were no vitals filed for this visit.  Visit Diagnosis:  Right hip pain  Difficulty walking  Weakness      Subjective Assessment - 05/03/15 0817    Subjective Overall better but still has some deep ache. Getting out of the car, bothers it. Getting up and walking helps.   Pertinent History R hip pain began June 2016 gradual onset. No falls, or known mechanism of injury. Pt states difficulty stepping up with his R LE to get up the steps due to pain and weakness. December 29, 2014 Dr. Hyacinth MeekerMiller gave pt a cortisone shot for his R hip which helped.  Pt also adds prior to June 2016, pt had a cortisone shot in low back due to pain (around March or April 2016). Pt was diagnosed with bursitis in his R hip.  Had an X-ray for low back which revealed a little arthritis but nothing significant. 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 On 04/09/2015 did some clean up work for church which increased R hip pain causing pt to limp.  Pt states R hip and lateral thigh bruised. Hand and X-ray for his R hip which was negative for abnormality. Was prescrbed diclofenac for 5 days which helped the pain a little.   Pt states he did not feel an impact, or popping for his R hip.  Difficulty crossing his R leg over his left.    Patient Stated Goals "I'd like to make it (hip pain) go away. "   Currently in Pain? Yes   Multiple Pain Sites No       Objectives:     There-ex:   Long sit test suggests anterior nutation or R innominate Directed patient with seated R hip ER 10x5 seconds (R hip ache and R thigh discomfort along L3 dermatome) Seated hip adduction pillow squeeze 10x3 with 5 second holds,  Supine R hip extension isometrics (leg straight) 10x10  second holds,  Supine SLR L hip flexion 10x3 Supine bilateral glute max extension isometrics (bridge position) 10x3 with 5 second holds Supine R glute stretch 10x10 seconds for 2 sets Standing bilateral shoulder extension resisting red band 10x2 with 5 second holds (to activate trunk muscles) Side stepping 32 ft x 2 to the R and L Standing 2 kg ball toss to trampoline with L foot on bosu 20x2 throws Standing ankle DF/PF on rocker board 2 min for tissue mobility   Improved exercise technique, movement at target joints, use of target muscles after mod verbal, visual, tactile cues.     No more bruising behind R knee. Per pt, bruising R hip and lateral thigh is gone. Tolerated session well without aggravation of symptoms. Per pt  description, R hip symptoms with R hip adduction and IR.                  PT Education - 05/03/15 [phone]    Education provided Yes   Education Details ther-ex   Person(s) Educated Patient   Methods Explanation;Demonstration;Tactile cues;Verbal cues   Comprehension Verbalized understanding;Returned demonstration             PT Long Term Goals - 04/24/15 1913    PT LONG TERM GOAL #1   Title Patient will have a decrease in R hip pain to 3/10 or less at worst to promote ability to ambulate, negotiate stairs, don and doff socks/shoes.   Time 4   Period Weeks   Status New   PT LONG TERM GOAL #2   Title Patient will improve bilateral hip abduction and glute max strength by at least 1/2 MMT grade to promote ability to ambulate with less pain in R hip.    Time 4   Period Weeks   Status New   PT LONG TERM GOAL #3   Title Patient will be able to ascend and descend 6 regular step with bilateral UE assist in a reciprocal pattern with 3/10 or less R hip pain to promote ability to enter and exit his home.    Time 4   Period Weeks   Status New               Plan - 05/03/15 0813    Clinical Impression Statement No more bruising behind R knee. Per pt, bruising R hip and lateral thigh is gone. Tolerated session well without aggravation of symptoms. Per pt description, R hip symptoms with R hip adduction and IR.    Pt will benefit from skilled therapeutic intervention in order to improve on the following deficits Decreased strength;Pain;Difficulty walking   Rehab Potential Good   Clinical Impairments Affecting Rehab Potential age   PT Frequency 2x / week   PT Duration 4 weeks   PT Treatment/Interventions Therapeutic exercise;Manual techniques;Therapeutic activities;Iontophoresis /ml Dexamethasone;Electrical Stimulation;Patient/family education;Ultrasound;Neuromuscular re-education   PT Next Visit Plan gentle hip strengthenthening, manual therapy   Consulted and Agree with  Plan of Care Patient        Problem List There are no active problems to display for this patient.  Loralyn Freshwater PT, DPT  05/03/2015, 10:28 AM  [facility] PHYSICAL AND SPORTS MEDICINE [address] [place], [address] Phone: [phone]   Fax:  [phone]  Name: Bryan Leonard MRN: [id number] Date of Birth: 1946-02-16

## 2015-05-10 ENCOUNTER — Ambulatory Visit: Payer: Medicare Other

## 2015-05-14 ENCOUNTER — Ambulatory Visit: Payer: Medicare Other

## 2015-06-08 ENCOUNTER — Other Ambulatory Visit: Payer: Self-pay | Admitting: Physician Assistant

## 2015-06-08 DIAGNOSIS — G5701 Lesion of sciatic nerve, right lower limb: Secondary | ICD-10-CM

## 2015-06-29 ENCOUNTER — Ambulatory Visit: Payer: Medicare Other

## 2015-08-24 ENCOUNTER — Emergency Department
Admission: EM | Admit: 2015-08-24 | Discharge: 2015-08-24 | Disposition: A | Discharge status: Home / Self Care | Payer: Medicare Other | Attending: Emergency Medicine | Admitting: Emergency Medicine

## 2015-08-24 ENCOUNTER — Encounter: Payer: Self-pay | Admitting: Emergency Medicine

## 2015-08-24 ENCOUNTER — Emergency Department: Payer: Medicare Other

## 2015-08-24 DIAGNOSIS — I10 Essential (primary) hypertension: Secondary | ICD-10-CM | POA: Insufficient documentation

## 2015-08-24 DIAGNOSIS — F419 Anxiety disorder, unspecified: Secondary | ICD-10-CM | POA: Insufficient documentation

## 2015-08-24 DIAGNOSIS — R002 Palpitations: Secondary | ICD-10-CM | POA: Diagnosis present

## 2015-08-24 DIAGNOSIS — Z951 Presence of aortocoronary bypass graft: Secondary | ICD-10-CM | POA: Insufficient documentation

## 2015-08-24 DIAGNOSIS — I252 Old myocardial infarction: Secondary | ICD-10-CM | POA: Diagnosis not present

## 2015-08-24 DIAGNOSIS — Z7982 Long term (current) use of aspirin: Secondary | ICD-10-CM | POA: Diagnosis not present

## 2015-08-24 DIAGNOSIS — Z79899 Other long term (current) drug therapy: Secondary | ICD-10-CM | POA: Diagnosis not present

## 2015-08-24 DIAGNOSIS — I2510 Atherosclerotic heart disease of native coronary artery without angina pectoris: Secondary | ICD-10-CM | POA: Insufficient documentation

## 2015-08-24 DIAGNOSIS — E785 Hyperlipidemia, unspecified: Secondary | ICD-10-CM | POA: Insufficient documentation

## 2015-08-24 LAB — CBC WITH DIFFERENTIAL/PLATELET
BASOS ABS: 0 10*3/uL (ref 0–0.1)
Basophils Relative: 1 %
EOS PCT: 1 %
Eosinophils Absolute: 0.1 10*3/uL (ref 0–0.7)
HCT: 49.3 % (ref 40.0–52.0)
HEMOGLOBIN: 17.3 g/dL (ref 13.0–18.0)
LYMPHS ABS: 1.7 10*3/uL (ref 1.0–3.6)
LYMPHS PCT: 26 %
MCH: 29.2 pg (ref 26.0–34.0)
MCHC: 35.1 g/dL (ref 32.0–36.0)
MCV: 83.1 fL (ref 80.0–100.0)
Monocytes Absolute: 0.6 10*3/uL (ref 0.2–1.0)
Monocytes Relative: 10 %
NEUTROS PCT: 62 %
Neutro Abs: 4.2 10*3/uL (ref 1.4–6.5)
PLATELETS: 129 10*3/uL — AB (ref 150–440)
RBC: 5.93 MIL/uL — AB (ref 4.40–5.90)
RDW: 15.3 % — ABNORMAL HIGH (ref 11.5–14.5)
WBC: 6.7 10*3/uL (ref 3.8–10.6)

## 2015-08-24 LAB — COMPREHENSIVE METABOLIC PANEL
ALK PHOS: 66 U/L (ref 38–126)
ALT: 24 U/L (ref 17–63)
AST: 28 U/L (ref 15–41)
Albumin: 4.2 g/dL (ref 3.5–5.0)
Anion gap: 5 (ref 5–15)
BUN: 14 mg/dL (ref 6–20)
CALCIUM: 9.4 mg/dL (ref 8.9–10.3)
CHLORIDE: 106 mmol/L (ref 101–111)
CO2: 22 mmol/L (ref 22–32)
CREATININE: 0.78 mg/dL (ref 0.61–1.24)
GFR calc Af Amer: >60 mL/min (ref >60)
GFR calc non Af Amer: >60 mL/min (ref >60)
Glucose, Bld: 128 mg/dL — ABNORMAL HIGH (ref 65–99)
Potassium: 3.3 mmol/L — ABNORMAL LOW (ref 3.5–5.1)
SODIUM: 133 mmol/L — AB (ref 135–145)
Total Bilirubin: 1.3 mg/dL — ABNORMAL HIGH (ref 0.3–1.2)
Total Protein: 6.8 g/dL (ref 6.5–8.1)

## 2015-08-24 LAB — TSH: TSH: 3.222 u[IU]/mL (ref 0.350–4.500)

## 2015-08-24 LAB — TROPONIN I
Troponin I: <0.03 ng/mL (ref <0.031)
Troponin I: <0.03 ng/mL (ref <0.031)

## 2015-08-24 MED ORDER — LORAZEPAM 2 MG/ML IJ SOLN
1.0000 mg | Freq: Once | INTRAMUSCULAR | Status: AC
  Administered 2015-08-24: 1 mg via INTRAVENOUS
  Filled 2015-08-24: qty 1

## 2015-08-24 NOTE — ED Notes (Signed)
Reports palpatations.  Skin w/d with good color.  Denies cp.

## 2015-08-24 NOTE — Discharge Instructions (Signed)
Palpitations A palpitation is the feeling that your heartbeat is irregular or is faster than normal. It may feel like your heart is fluttering or skipping a beat. Palpitations are usually not a serious problem. However, in some cases, you may need further medical evaluation. CAUSES  Palpitations can be caused by:  Smoking.  Caffeine or other stimulants, such as diet pills or energy drinks.  Alcohol.  Stress and anxiety.  Strenuous physical activity.  Fatigue.  Certain medicines.  Heart disease, especially if you have a history of irregular heart rhythms (arrhythmias), such as atrial fibrillation, atrial flutter, or supraventricular tachycardia.  An improperly working pacemaker or defibrillator. DIAGNOSIS  To find the cause of your palpitations, your health care provider will take your medical history and perform a physical exam. Your health care provider may also have you take a test called an ambulatory electrocardiogram (ECG). An ECG records your heartbeat patterns over a 24-hour period. You may also have other tests, such as:  Transthoracic echocardiogram (TTE). During echocardiography, sound waves are used to evaluate how blood flows through your heart.  Transesophageal echocardiogram (TEE).  Cardiac monitoring. This allows your health care provider to monitor your heart rate and rhythm in real time.  Holter monitor. This is a portable device that records your heartbeat and can help diagnose heart arrhythmias. It allows your health care provider to track your heart activity for several days, if needed.  Stress tests by exercise or by giving medicine that makes the heart beat faster. TREATMENT  Treatment of palpitations depends on the cause of your symptoms and can vary greatly. Most cases of palpitations do not require any treatment other than time, relaxation, and monitoring your symptoms. Other causes, such as atrial fibrillation, atrial flutter, or supraventricular  tachycardia, usually require further treatment. HOME CARE INSTRUCTIONS   Avoid:  Caffeinated coffee, tea, soft drinks, diet pills, and energy drinks.  Chocolate.  Alcohol.  Stop smoking if you smoke.  Reduce your stress and anxiety. Things that can help you relax include:  A method of controlling things in your body, such as your heartbeats, with your mind (biofeedback).  Yoga.  Meditation.  Physical activity such as swimming, jogging, or walking.  Get plenty of rest and sleep. SEEK MEDICAL CARE IF:   You continue to have a fast or irregular heartbeat beyond 24 hours.  Your palpitations occur more often. SEEK IMMEDIATE MEDICAL CARE IF:  You have chest pain or shortness of breath.  You have a severe headache.  You feel dizzy or you faint. MAKE SURE YOU:  Understand these instructions.  Will watch your condition.  Will get help right away if you are not doing well or get worse.   This information is not intended to replace advice given to you by your health care provider. Make sure you discuss any questions you have with your health care provider.   Document Released: 05/09/2000 Document Revised: 05/17/2013 Document Reviewed: 07/11/2011 Elsevier Interactive Patient Education 2016 Elsevier Inc[employer]  Panic Attacks Panic attacks are sudden, short-livedsurges of severe anxiety, fear, or discomfort. They may occur for no reason when you are relaxed, when you are anxious, or when you are sleeping. Panic attacks may occur for a number of reasons:   Healthy people occasionally have panic attacks in extreme, life-threatening situations, such as war or natural disasters. Normal anxiety is a protective mechanism of the body that helps us react to danger (fight or flight response).  Panic attacks are often seen with anxiety disorders, such  as panic disorder, social anxiety disorder, generalized anxiety disorder, and phobias. Anxiety disorders cause excessive or uncontrollable  anxiety. They may interfere with your relationships or other life activities. °· Panic attacks are sometimes seen with other mental illnesses, such as depression and posttraumatic stress disorder. °· Certain medical conditions, prescription medicines, and drugs of abuse can cause panic attacks. °SYMPTOMS  °Panic attacks start suddenly, peak within 20 minutes, and are accompanied by four or more of the following symptoms: °· Pounding heart or fast heart rate (palpitations). °· Sweating. °· Trembling or shaking. °· Shortness of breath or feeling smothered. °· Feeling choked. °· Chest pain or discomfort. °· Nausea or strange feeling in your stomach. °· Dizziness, light-headedness, or feeling like you will faint. °· Chills or hot flushes. °· Numbness or tingling in your lips or hands and feet. °· Feeling that things are not real or feeling that you are not yourself. °· Fear of losing control or going crazy. °· Fear of dying. °Some of these symptoms can mimic serious medical conditions. For example, you may think you are having a heart attack. Although panic attacks can be very scary, they are not life threatening. °DIAGNOSIS  °Panic attacks are diagnosed through an assessment by your health care provider. Your health care provider will ask questions about your symptoms, such as where and when they occurred. Your health care provider will also ask about your medical history and use of alcohol and drugs, including prescription medicines. Your health care provider may order blood tests or other studies to rule out a serious medical condition. Your health care provider may refer you to a mental health professional for further evaluation. °TREATMENT  °· Most healthy people who have one or two panic attacks in an extreme, life-threatening situation will not require treatment. °· The treatment for panic attacks associated with anxiety disorders or other mental illness typically involves counseling with a mental health  professional, medicine, or a combination of both. Your health care provider will help determine what treatment is best for you. °· Panic attacks due to physical illness usually go away with treatment of the illness. If prescription medicine is causing panic attacks, talk with your health care provider about stopping the medicine, decreasing the dose, or substituting another medicine. °· Panic attacks due to alcohol or drug abuse go away with abstinence. Some adults need professional help in order to stop drinking or using drugs. °HOME CARE INSTRUCTIONS  °· Take all medicines as directed by your health care provider.   °· Schedule and attend follow-up visits as directed by your health care provider. It is important to keep all your appointments. °SEEK MEDICAL CARE IF: °· You are not able to take your medicines as prescribed. °· Your symptoms do not improve or get worse. °SEEK IMMEDIATE MEDICAL CARE IF:  °· You experience panic attack symptoms that are different than your usual symptoms. °· You have serious thoughts about hurting yourself or others. °· You are taking medicine for panic attacks and have a serious side effect. °MAKE SURE YOU: °· Understand these instructions. °· Will watch your condition. °· Will get help right away if you are not doing well or get worse. °  °This information is not intended to replace advice given to you by your health care provider. Make sure you discuss any questions you have with your health care provider. °  °Document Released: 05/12/2005 Document Revised: 05/17/2013 Document Reviewed: 12/24/2012 °Elsevier Interactive Patient Education ©2016 Elsevier Inc. ° °

## 2015-08-24 NOTE — ED Provider Notes (Signed)
[facility] Emergency Department Provider Note     Time seen: ----------------------------------------- 1:20 PM on 08/24/2015 -----------------------------------------    I have reviewed the triage vital signs and the nursing notes.   HISTORY  Chief Complaint Palpitations    HPI Bryan Leonard is a 70 y.o. male who presents to ER for palpitations, blood pressure increase in feeling like he was getting "roughed up". Patient reports history of anxiety, also has an extensive cardiac history was concerned due to his fast heart rate and high blood pressure that he checked at home. Patient is not had any recent illness, no changes in his medicines. Patient denies any chest pain.   Past Medical History  Diagnosis Date  . Coronary artery disease   . Myocardial infarction (HCC)   . Gout   . Pulmonary nodules   . Hypertension   . Hyperlipidemia   . ASCVD (arteriosclerotic cardiovascular disease)     There are no active problems to display for this patient.   Past Surgical History  Procedure Laterality Date  . Appendectomy    . Cholecystectomy    . Coronary artery bypass graft    . Pseudoaneursym Right     right groin  . Cardiac catheterization    . Esophagogastroduodenoscopy (egd) with propofol N/A 02/05/2015    Procedure: ESOPHAGOGASTRODUODENOSCOPY (EGD) WITH PROPOFOL;  Surgeon: Christena Deem, MD;  Location: [facility] ENDOSCOPY;  Service: Endoscopy;  Laterality: N/A;    Allergies Ace inhibitors; Cardizem; Prilosec otc; and Verapamil hcl er  Social History Social History  Substance Use Topics  . Smoking status: Never Smoker   . Smokeless tobacco: Never Used  . Alcohol Use: No    Review of Systems Constitutional: Negative for fever. Eyes: Negative for visual changes. ENT: Negative for sore throat. Cardiovascular: Negative for chest pain. Positive for palpitations Respiratory: Negative for shortness of breath. Gastrointestinal: Negative  for abdominal pain, vomiting and diarrhea. Genitourinary: Negative for dysuria. Musculoskeletal: Negative for back pain. Skin: Negative for rash. Neurological: Negative for headaches, focal weakness or numbness.  10-point ROS otherwise negative.  ____________________________________________   PHYSICAL EXAM:  VITAL SIGNS: ED Triage Vitals  Enc Vitals Group     BP 08/24/15 1300 183/84 mmHg     Pulse Rate 08/24/15 1300 108     Resp 08/24/15 1300 17     Temp --      Temp src --      SpO2 08/24/15 1300 100 %     Weight --      Height --      Head Cir --      Peak Flow --      Pain Score --      Pain Loc --      Pain Edu? --      Excl. in GC? --     Constitutional: Alert, anxious, no acute distress Eyes: Conjunctivae are normal. PERRL. Normal extraocular movements. ENT   Head: Normocephalic and atraumatic.   Nose: No congestion/rhinnorhea.   Mouth/Throat: Mucous membranes are moist.   Neck: No stridor. Cardiovascular: Rapid rate, regular rhythm. Normal and symmetric distal pulses are present in all extremities. No murmurs, rubs, or gallops. Respiratory: Normal respiratory effort without tachypnea nor retractions. Breath sounds are clear and equal bilaterally. No wheezes/rales/rhonchi. Gastrointestinal: Soft and nontender. No distention. No abdominal bruits.  Musculoskeletal: Nontender with normal range of motion in all extremities. No joint effusions.  No lower extremity tenderness nor edema. Neurologic:  Normal speech and language. No  gross focal neurologic deficits are appreciated.  Skin:  Skin is warm, dry and intact. No rash noted. Psychiatric: Mood and affect are normal. Speech and behavior are normal. Patient exhibits appropriate insight and judgment. ____________________________________________  EKG: Interpreted by me. Sinus tachycardia with a rate of 123 bpm, normal PR interval, wide QRS, normal QT interval. Right bundle branch  block.  ____________________________________________  ED COURSE:  Pertinent labs & imaging results that were available during my care of the patient were reviewed by me and considered in my medical decision making (see chart for details). Patient presented with tachycardia and palpitations resembling a panic attack. He'll be given IV Ativan, I will check basic labs and reevaluate. ____________________________________________    LABS (pertinent positives/negatives)  Labs Reviewed  CBC WITH DIFFERENTIAL/PLATELET - Abnormal; Notable for the following:    RBC 5.93 (*)    RDW 15.3 (*)    Platelets 129 (*)    All other components within normal limits  COMPREHENSIVE METABOLIC PANEL - Abnormal; Notable for the following:    Sodium 133 (*)    Potassium 3.3 (*)    Glucose, Bld 128 (*)    Total Bilirubin 1.3 (*)    All other components within normal limits  TROPONIN I  TROPONIN I  TSH    RADIOLOGY  Chest x-ray  IMPRESSION: No active disease. ____________________________________________  FINAL ASSESSMENT AND PLAN  Palpitations, Anxiety  Plan: Patient with labs and imaging as dictated above. Patient is in no acute distress, labs including serial troponins and TSH have been unremarkable. This appears to be anxiety related. He stable for outpatient follow-up with his doctor.   Emily FilbertWilliams, Shivani Barrantes E, MD   Emily FilbertJonathan E Obriant, MD 08/24/15 [phone]

## 2015-11-14 ENCOUNTER — Other Ambulatory Visit: Payer: Self-pay | Admitting: Internal Medicine

## 2015-11-14 DIAGNOSIS — R55 Syncope and collapse: Secondary | ICD-10-CM

## 2015-11-20 ENCOUNTER — Ambulatory Visit
Admission: RE | Admit: 2015-11-20 | Discharge: 2015-11-20 | Disposition: A | Discharge status: Home / Self Care | Payer: Medicare Other | Source: Ambulatory Visit | Attending: Internal Medicine | Admitting: Internal Medicine

## 2015-11-20 DIAGNOSIS — R55 Syncope and collapse: Secondary | ICD-10-CM | POA: Diagnosis present

## 2015-11-20 DIAGNOSIS — I6523 Occlusion and stenosis of bilateral carotid arteries: Secondary | ICD-10-CM | POA: Insufficient documentation

## 2016-01-24 ENCOUNTER — Encounter
Admission: RE | Disposition: A | Discharge status: Home / Self Care | Payer: Self-pay | Source: Ambulatory Visit | Attending: Ophthalmology

## 2016-01-24 ENCOUNTER — Ambulatory Visit: Payer: Medicare Other | Admitting: Anesthesiology

## 2016-01-24 ENCOUNTER — Encounter: Payer: Self-pay | Admitting: *Deleted

## 2016-01-24 ENCOUNTER — Ambulatory Visit
Admission: RE | Admit: 2016-01-24 | Discharge: 2016-01-24 | Disposition: A | Discharge status: Home / Self Care | Payer: Medicare Other | Source: Ambulatory Visit | Attending: Ophthalmology | Admitting: Ophthalmology

## 2016-01-24 DIAGNOSIS — I10 Essential (primary) hypertension: Secondary | ICD-10-CM | POA: Insufficient documentation

## 2016-01-24 DIAGNOSIS — H9193 Unspecified hearing loss, bilateral: Secondary | ICD-10-CM | POA: Diagnosis not present

## 2016-01-24 DIAGNOSIS — L719 Rosacea, unspecified: Secondary | ICD-10-CM | POA: Insufficient documentation

## 2016-01-24 DIAGNOSIS — I25811 Atherosclerosis of native coronary artery of transplanted heart without angina pectoris: Secondary | ICD-10-CM | POA: Diagnosis not present

## 2016-01-24 DIAGNOSIS — Z888 Allergy status to other drugs, medicaments and biological substances status: Secondary | ICD-10-CM | POA: Diagnosis not present

## 2016-01-24 DIAGNOSIS — I252 Old myocardial infarction: Secondary | ICD-10-CM | POA: Diagnosis not present

## 2016-01-24 DIAGNOSIS — K219 Gastro-esophageal reflux disease without esophagitis: Secondary | ICD-10-CM | POA: Diagnosis not present

## 2016-01-24 DIAGNOSIS — E7800 Pure hypercholesterolemia, unspecified: Secondary | ICD-10-CM | POA: Insufficient documentation

## 2016-01-24 DIAGNOSIS — R42 Dizziness and giddiness: Secondary | ICD-10-CM | POA: Insufficient documentation

## 2016-01-24 DIAGNOSIS — Z981 Arthrodesis status: Secondary | ICD-10-CM | POA: Insufficient documentation

## 2016-01-24 DIAGNOSIS — Z955 Presence of coronary angioplasty implant and graft: Secondary | ICD-10-CM | POA: Insufficient documentation

## 2016-01-24 DIAGNOSIS — Z9049 Acquired absence of other specified parts of digestive tract: Secondary | ICD-10-CM | POA: Diagnosis not present

## 2016-01-24 DIAGNOSIS — H2511 Age-related nuclear cataract, right eye: Secondary | ICD-10-CM | POA: Insufficient documentation

## 2016-01-24 DIAGNOSIS — Z951 Presence of aortocoronary bypass graft: Secondary | ICD-10-CM | POA: Insufficient documentation

## 2016-01-24 HISTORY — PX: CATARACT EXTRACTION W/PHACO: SHX586

## 2016-01-24 HISTORY — DX: Gastro-esophageal reflux disease without esophagitis: K21.9

## 2016-01-24 HISTORY — DX: Dizziness and giddiness: R42

## 2016-01-24 HISTORY — DX: Unspecified hearing loss, unspecified ear: H91.90

## 2016-01-24 SURGERY — PHACOEMULSIFICATION, CATARACT, WITH IOL INSERTION
Anesthesia: Monitor Anesthesia Care | Site: Eye | Laterality: Right | Wound class: Clean

## 2016-01-24 MED ORDER — POVIDONE-IODINE 5 % OP SOLN
OPHTHALMIC | Status: AC
  Filled 2016-01-24: qty 30

## 2016-01-24 MED ORDER — EPINEPHRINE HCL 1 MG/ML IJ SOLN
INTRAMUSCULAR | Status: AC
  Filled 2016-01-24: qty 1

## 2016-01-24 MED ORDER — LIDOCAINE HCL (PF) 4 % IJ SOLN
INTRAMUSCULAR | Status: AC
  Filled 2016-01-24: qty 5

## 2016-01-24 MED ORDER — MOXIFLOXACIN HCL 0.5 % OP SOLN
OPHTHALMIC | Status: AC
  Filled 2016-01-24: qty 3

## 2016-01-24 MED ORDER — SODIUM HYALURONATE 23 MG/ML IO SOLN
INTRAOCULAR | Status: AC
  Filled 2016-01-24: qty 0.6

## 2016-01-24 MED ORDER — SODIUM HYALURONATE 23 MG/ML IO SOLN
INTRAOCULAR | Status: DC | PRN
  Administered 2016-01-24: 0.6 mL via INTRAOCULAR

## 2016-01-24 MED ORDER — MIDAZOLAM HCL 2 MG/2ML IJ SOLN
INTRAMUSCULAR | Status: DC | PRN
  Administered 2016-01-24: 1 mg via INTRAVENOUS

## 2016-01-24 MED ORDER — POVIDONE-IODINE 5 % OP SOLN
1.0000 "application " | OPHTHALMIC | Status: AC | PRN
  Administered 2016-01-24: 1 via OPHTHALMIC

## 2016-01-24 MED ORDER — LIDOCAINE HCL (PF) 4 % IJ SOLN
INTRAMUSCULAR | Status: DC | PRN
  Administered 2016-01-24: 4 mL via OPHTHALMIC

## 2016-01-24 MED ORDER — FENTANYL CITRATE (PF) 100 MCG/2ML IJ SOLN
INTRAMUSCULAR | Status: DC | PRN
  Administered 2016-01-24: 50 ug via INTRAVENOUS

## 2016-01-24 MED ORDER — MOXIFLOXACIN HCL 0.5 % OP SOLN
1.0000 [drp] | OPHTHALMIC | Status: AC | PRN
  Administered 2016-01-24: 1 [drp] via OPHTHALMIC
  Filled 2016-01-24: qty 3

## 2016-01-24 MED ORDER — SODIUM CHLORIDE 0.9 % IV SOLN
INTRAVENOUS | Status: DC
  Administered 2016-01-24 (×2): via INTRAVENOUS

## 2016-01-24 MED ORDER — TETRACAINE HCL 0.5 % OP SOLN
OPHTHALMIC | Status: AC
  Filled 2016-01-24: qty 2

## 2016-01-24 MED ORDER — ARMC OPHTHALMIC DILATING GEL
OPHTHALMIC | Status: AC
  Filled 2016-01-24: qty 0.25

## 2016-01-24 MED ORDER — ARMC OPHTHALMIC DILATING GEL
1.0000 | OPHTHALMIC | Status: AC | PRN
Start: 2016-01-24 — End: 2016-01-24
  Administered 2016-01-24 (×2): 1 via OPHTHALMIC

## 2016-01-24 MED ORDER — TETRACAINE HCL 0.5 % OP SOLN
1.0000 [drp] | OPHTHALMIC | Status: AC | PRN
  Administered 2016-01-24: 1 [drp] via OPHTHALMIC

## 2016-01-24 MED ORDER — SODIUM HYALURONATE 10 MG/ML IO SOLN
INTRAOCULAR | Status: AC
  Filled 2016-01-24: qty 0.85

## 2016-01-24 MED ORDER — ONDANSETRON HCL 4 MG/2ML IJ SOLN
INTRAMUSCULAR | Status: DC | PRN
  Administered 2016-01-24: 4 mg via INTRAVENOUS

## 2016-01-24 MED ORDER — SODIUM HYALURONATE 10 MG/ML IO SOLN
INTRAOCULAR | Status: DC | PRN
  Administered 2016-01-24: 0.85 mL via INTRAOCULAR

## 2016-01-24 MED ORDER — EPINEPHRINE HCL 1 MG/ML IJ SOLN
INTRAMUSCULAR | Status: DC | PRN
  Administered 2016-01-24: 1 mL via OPHTHALMIC

## 2016-01-24 SURGICAL SUPPLY — 23 items
CANNULA ANT/CHMB 27G (MISCELLANEOUS) ×2 IMPLANT
CANNULA ANT/CHMB 27GA (MISCELLANEOUS) ×6 IMPLANT
CUP MEDICINE 2OZ PLAST GRAD ST (MISCELLANEOUS) ×3 IMPLANT
GLOVE BIO SURGEON STRL SZ8 (GLOVE) ×3 IMPLANT
GLOVE BIOGEL M 6.5 STRL (GLOVE) ×3 IMPLANT
GLOVE SURG LX 7.5 STRW (GLOVE) ×2
GLOVE SURG LX STRL 7.5 STRW (GLOVE) ×1 IMPLANT
GOWN STRL REUS W/ TWL LRG LVL3 (GOWN DISPOSABLE) ×2 IMPLANT
GOWN STRL REUS W/TWL LRG LVL3 (GOWN DISPOSABLE) ×6
LENS IOL ACRSF IQ PC 24.5 (Intraocular Lens) IMPLANT
LENS IOL ACRYSOF IQ POST 24.5 (Intraocular Lens) ×3 IMPLANT
PACK CATARACT (MISCELLANEOUS) ×3 IMPLANT
PACK CATARACT BRASINGTON LX (MISCELLANEOUS) ×3 IMPLANT
PACK EYE AFTER SURG (MISCELLANEOUS) ×3 IMPLANT
SOL BSS BAG (MISCELLANEOUS) ×3
SOL PREP PVP 2OZ (MISCELLANEOUS) ×3
SOLUTION BSS BAG (MISCELLANEOUS) ×1 IMPLANT
SOLUTION PREP PVP 2OZ (MISCELLANEOUS) ×1 IMPLANT
SYR 3ML LL SCALE MARK (SYRINGE) ×6 IMPLANT
SYR 5ML LL (SYRINGE) ×3 IMPLANT
SYR TB 1ML 27GX1/2 LL (SYRINGE) ×3 IMPLANT
WATER STERILE IRR 250ML POUR (IV SOLUTION) ×3 IMPLANT
WIPE NON LINTING 3.25X3.25 (MISCELLANEOUS) ×3 IMPLANT

## 2016-01-24 NOTE — Transfer of Care (Signed)
Immediate Anesthesia Transfer of Care Note  Patient: Bryan Leonard  Procedure(s) Performed: Procedure(s) with comments: CATARACT EXTRACTION PHACO AND INTRAOCULAR LENS PLACEMENT (IOC) (Right) - US 58.1 AP% 6.8 CDE 3.94 Fluid pack lot # [id number]  Patient Location: PACU  Anesthesia Type:MAC  Level of Consciousness: awake, alert  and oriented  Airway & Oxygen Therapy: Patient Spontanous Breathing  Post-op Assessment: Report given to RN and Post -op Vital signs reviewed and stable  Post vital signs: Reviewed and stable  Last Vitals:  Vitals:   01/24/16 0613  BP: (!) 170/89  Pulse: 80  Resp: 18  Temp: 36.7 C    Last Pain:  Vitals:   01/24/16 0613  TempSrc: Oral         Complications: No apparent anesthesia complications

## 2016-01-24 NOTE — Progress Notes (Signed)
Pt. States she has really bad vertigo, and also states He becomes really anxious with this.  Will notify staff  caring for patient.  Has had recent appts. With ENT Also to address the vertigo issues.  Reassured patient And wife.

## 2016-01-24 NOTE — Anesthesia Postprocedure Evaluation (Signed)
Anesthesia Post Note  Patient: Bryan Leonard  Procedure(s) Performed: Procedure(s) (LRB): CATARACT EXTRACTION PHACO AND INTRAOCULAR LENS PLACEMENT (IOC) (Right)  Patient location during evaluation: PACU Anesthesia Type: MAC Level of consciousness: awake, awake and alert and oriented Pain management: pain level controlled Vital Signs Assessment: post-procedure vital signs reviewed and stable Respiratory status: spontaneous breathing, nonlabored ventilation and respiratory function stable Cardiovascular status: stable Postop Assessment: no headache    Last Vitals:  Vitals:   01/24/16 0613  BP: (!) 170/89  Pulse: 80  Resp: 18  Temp: 36.7 C    Last Pain:  Vitals:   01/24/16 19140613  TempSrc: Oral                 Keston Seever,  Sheran FavaMark R

## 2016-01-24 NOTE — Discharge Instructions (Signed)
Eye Surgery Discharge Instructions  Expect mild scratchy sensation or mild soreness. DO NOT RUB YOUR EYE!  The day of surgery:  Minimal physical activity, but bed rest is not required  No reading, computer work, or close hand work  No bending, lifting, or straining.  May watch TV  For 24 hours:  No driving, legal decisions, or alcoholic beverages  Safety precautions  Eat anything you prefer: It is better to start with liquids, then soup then solid foods.  _____ Eye patch should be worn until postoperative exam tomorrow.  ____ Solar shield eyeglasses should be worn for comfort in the sunlight/patch while sleeping  Resume all regular medications including aspirin or Coumadin if these were discontinued prior to surgery. You may shower, bathe, shave, or wash your hair. Tylenol may be taken for mild discomfort.  Call your doctor if you experience significant pain, nausea, or vomiting, fever > 101 or other signs of infection. [phone] or [phone] Specific instructions:  Follow-up Information    Anjanette Charyl Biggerhompson King, NP .   Specialty:  Obstetrics and Gynecology Contact information: [address] [place] [address] [phone]        Willey BladeBradley King, MD .   Specialty:  Ophthalmology Why:  01/25/16 at 8:45 Contact information: [address] [place] [address] [phone]          Eye Surgery Discharge Instructions  Expect mild scratchy sensation or mild soreness. DO NOT RUB YOUR EYE!  The day of surgery:  Minimal physical activity, but bed rest is not required  No reading, computer work, or close hand work  No bending, lifting, or straining.  May watch TV  For 24 hours:  No driving, legal decisions, or alcoholic beverages  Safety precautions  Eat anything you prefer: It is better to start with liquids, then soup then solid foods.  _____ Eye patch should be worn until postoperative exam tomorrow.  ____ Solar  shield eyeglasses should be worn for comfort in the sunlight/patch while sleeping  Resume all regular medications including aspirin or Coumadin if these were discontinued prior to surgery. You may shower, bathe, shave, or wash your hair. Tylenol may be taken for mild discomfort.  Call your doctor if you experience significant pain, nausea, or vomiting, fever > 101 or other signs of infection. [phone] or [phone] Specific instructions:  Follow-up Information    Anjanette Charyl Biggerhompson King, NP .   Specialty:  Obstetrics and Gynecology Contact information: [address] [place] [address] [phone]        Willey BladeBradley King, MD .   Specialty:  Ophthalmology Why:  01/25/16 at 8:45 Contact information: [address] [place] [address] [phone]

## 2016-01-24 NOTE — H&P (Signed)
  The History and Physical notes are on paper, have been signed, and are to be scanned. The patient remains stable and unchanged from the H&P.   Previous H&P reviewed, patient examined, and there are no changes.  Bryan Leonard 01/24/2016 7:19 AM

## 2016-01-24 NOTE — Anesthesia Preprocedure Evaluation (Signed)
Anesthesia Evaluation  Patient identified by MRN, date of birth, ID band Patient awake    Reviewed: Allergy & Precautions, NPO status , Patient's Chart, lab work & pertinent test results, reviewed documented beta blocker date and time   History of Anesthesia Complications Negative for: history of anesthetic complications  Airway Mallampati: III  TM Distance: >3 FB     Dental  (+) Chipped, Caps, Missing   Pulmonary neg pulmonary ROS,           Cardiovascular Exercise Tolerance: Good hypertension, Pt. on medications and Pt. on home beta blockers (-) angina+ CAD, + Past MI, + Cardiac Stents and + CABG  (-) dysrhythmias (-) Valvular Problems/Murmurs     Neuro/Psych PSYCHIATRIC DISORDERS (Anxiety) negative neurological ROS     GI/Hepatic Neg liver ROS, GERD  Medicated,  Endo/Other  negative endocrine ROS  Renal/GU negative Renal ROS  negative genitourinary   Musculoskeletal   Abdominal   Peds  Hematology negative hematology ROS (+)   Anesthesia Other Findings MI. CABG 2008. 1996 stent. Cervical fusion 2007. Stress echo 2 weeks ago normal. Has hx of panic attacks.  Reproductive/Obstetrics negative OB ROS                             Anesthesia Physical  Anesthesia Plan  ASA: III  Anesthesia Plan: MAC   Post-op Pain Management:    Induction:   Airway Management Planned: Nasal Cannula  Additional Equipment:   Intra-op Plan:   Post-operative Plan:   Informed Consent: I have reviewed the patients History and Physical, chart, labs and discussed the procedure including the risks, benefits and alternatives for the proposed anesthesia with the patient or authorized representative who has indicated his/her understanding and acceptance.   Dental Advisory Given  Plan Discussed with: CRNA  Anesthesia Plan Comments:         Anesthesia Quick Evaluation

## 2016-01-24 NOTE — Op Note (Signed)
OPERATIVE NOTE  Bryan Leonard [id number] 01/24/2016   PREOPERATIVE DIAGNOSIS:  Nuclear sclerotic cataract right eye.  H25.11   POSTOPERATIVE DIAGNOSIS:    Nuclear sclerotic cataract right eye. 2.  Intraoperative floppy iris syndrome.     PROCEDURE:  Phacoemusification with posterior chamber intraocular lens placement of the right eye   LENS:   Implant Name Type Inv. Item Serial No. Manufacturer Lot No. LRB No. Used  IMPLANT LENS - [id number] Intraocular Lens IMPLANT LENS [id number] ALCON   Right 1       [id number] 24.5   ULTRASOUND TIME: 0 minutes 58 seconds.  CDE 3.94   SURGEON:  Bryan BladeBradley Curren Mohrmann, MD, MPH  ANESTHESIOLOGIST: Anesthesiologist: Lenard SimmerAndrew Karenz, MD CRNA: Junious SilkMark Noles, CRNA   ANESTHESIA:  Topical with tetracaine drops and 2% Xylocaine jelly, augmented with 1% preservative-free intracameral lidocaine.  ESTIMATED BLOOD LOSS: less than 1 mL.   COMPLICATIONS:  None.   DESCRIPTION OF PROCEDURE:  The patient was identified in the holding room and transported to the operating room and placed in the supine position under the operating microscope.  The right eye was identified as the operative eye and it was prepped and draped in the usual sterile ophthalmic fashion.   A 1.0 millimeter clear-corneal paracentesis was made at the 10:30 position. 0.5 ml of preservative-free 1% lidocaine with epinephrine was injected into the anterior chamber.  The anterior chamber was filled with Healon 5 viscoelastic.  A 2.4 millimeter keratome was used to make a near-clear corneal incision at the 8:00 position.  A curvilinear capsulorrhexis was made with a cystotome and capsulorrhexis forceps.  Balanced salt solution was used to hydrodissect and hydrodelineate the nucleus.  The iris was relatively floppy but there was no iris prolapse.  Additional Healon5 was placed to help stabilize the iris.   Phacoemulsification was then used in stop and chop fashion to remove the lens nucleus and epinucleus.   The remaining cortex was then removed using the irrigation and aspiration handpiece. Healon was then placed into the capsular bag to distend it for lens placement.  A lens was then injected into the capsular bag.  The remaining viscoelastic was aspirated.   Wounds were hydrated with balanced salt solution.  The anterior chamber was inflated to a physiologic pressure with balanced salt solution.   Intracameral vigamox 0.1 mL undiluted was injected into the eye.  No wound leaks were noted.  Topical Vigamox drops were applied to the eye.  The patient was taken to the recovery room in stable condition without complications of anesthesia or surgery  Bryan BladeBradley Alexey Rhoads 01/24/2016, 7:51 AM

## 2016-02-11 ENCOUNTER — Encounter: Payer: Self-pay | Admitting: *Deleted

## 2016-02-14 ENCOUNTER — Ambulatory Visit: Payer: Medicare Other | Admitting: Anesthesiology

## 2016-02-14 ENCOUNTER — Ambulatory Visit
Admission: RE | Admit: 2016-02-14 | Discharge: 2016-02-14 | Disposition: A | Discharge status: Home / Self Care | Payer: Medicare Other | Source: Ambulatory Visit | Attending: Ophthalmology | Admitting: Ophthalmology

## 2016-02-14 ENCOUNTER — Encounter: Payer: Self-pay | Admitting: *Deleted

## 2016-02-14 ENCOUNTER — Encounter
Admission: RE | Disposition: A | Discharge status: Home / Self Care | Payer: Self-pay | Source: Ambulatory Visit | Attending: Ophthalmology

## 2016-02-14 DIAGNOSIS — H578 Other specified disorders of eye and adnexa: Secondary | ICD-10-CM | POA: Insufficient documentation

## 2016-02-14 DIAGNOSIS — Z9049 Acquired absence of other specified parts of digestive tract: Secondary | ICD-10-CM | POA: Insufficient documentation

## 2016-02-14 DIAGNOSIS — H9193 Unspecified hearing loss, bilateral: Secondary | ICD-10-CM | POA: Insufficient documentation

## 2016-02-14 DIAGNOSIS — Z981 Arthrodesis status: Secondary | ICD-10-CM | POA: Insufficient documentation

## 2016-02-14 DIAGNOSIS — Z9841 Cataract extraction status, right eye: Secondary | ICD-10-CM | POA: Diagnosis not present

## 2016-02-14 DIAGNOSIS — L719 Rosacea, unspecified: Secondary | ICD-10-CM | POA: Diagnosis not present

## 2016-02-14 DIAGNOSIS — I10 Essential (primary) hypertension: Secondary | ICD-10-CM | POA: Insufficient documentation

## 2016-02-14 DIAGNOSIS — Z888 Allergy status to other drugs, medicaments and biological substances status: Secondary | ICD-10-CM | POA: Diagnosis not present

## 2016-02-14 DIAGNOSIS — I2510 Atherosclerotic heart disease of native coronary artery without angina pectoris: Secondary | ICD-10-CM | POA: Diagnosis not present

## 2016-02-14 DIAGNOSIS — E7800 Pure hypercholesterolemia, unspecified: Secondary | ICD-10-CM | POA: Insufficient documentation

## 2016-02-14 DIAGNOSIS — Z955 Presence of coronary angioplasty implant and graft: Secondary | ICD-10-CM | POA: Insufficient documentation

## 2016-02-14 DIAGNOSIS — F410 Panic disorder [episodic paroxysmal anxiety] without agoraphobia: Secondary | ICD-10-CM | POA: Insufficient documentation

## 2016-02-14 DIAGNOSIS — F419 Anxiety disorder, unspecified: Secondary | ICD-10-CM | POA: Insufficient documentation

## 2016-02-14 DIAGNOSIS — H2512 Age-related nuclear cataract, left eye: Secondary | ICD-10-CM | POA: Insufficient documentation

## 2016-02-14 DIAGNOSIS — R42 Dizziness and giddiness: Secondary | ICD-10-CM | POA: Diagnosis not present

## 2016-02-14 DIAGNOSIS — I252 Old myocardial infarction: Secondary | ICD-10-CM | POA: Diagnosis not present

## 2016-02-14 DIAGNOSIS — Z951 Presence of aortocoronary bypass graft: Secondary | ICD-10-CM | POA: Insufficient documentation

## 2016-02-14 DIAGNOSIS — K219 Gastro-esophageal reflux disease without esophagitis: Secondary | ICD-10-CM | POA: Insufficient documentation

## 2016-02-14 HISTORY — PX: ANTERIOR VITRECTOMY: SHX1173

## 2016-02-14 HISTORY — PX: CATARACT EXTRACTION W/PHACO: SHX586

## 2016-02-14 SURGERY — PHACOEMULSIFICATION, CATARACT, WITH IOL INSERTION
Anesthesia: Monitor Anesthesia Care | Site: Eye | Laterality: Left | Wound class: Clean

## 2016-02-14 MED ORDER — MOXIFLOXACIN HCL 0.5 % OP SOLN
OPHTHALMIC | Status: DC | PRN
  Administered 2016-02-14: 1 [drp] via OPHTHALMIC

## 2016-02-14 MED ORDER — BSS IO SOLN
INTRAOCULAR | Status: DC | PRN
  Administered 2016-02-14: 15 mL via INTRAOCULAR

## 2016-02-14 MED ORDER — SODIUM HYALURONATE 23 MG/ML IO SOLN
INTRAOCULAR | Status: AC
  Filled 2016-02-14: qty 0.6

## 2016-02-14 MED ORDER — EPINEPHRINE HCL 1 MG/ML IJ SOLN
INTRAMUSCULAR | Status: AC
  Filled 2016-02-14: qty 2

## 2016-02-14 MED ORDER — LIDOCAINE HCL (PF) 4 % IJ SOLN
INTRAOCULAR | Status: DC | PRN
  Administered 2016-02-14: 4 mL via OPHTHALMIC

## 2016-02-14 MED ORDER — NA CHONDROIT SULF-NA HYALURON 40-30 MG/ML IO SOLN
INTRAOCULAR | Status: DC | PRN
  Administered 2016-02-14: 0.5 mL via INTRAOCULAR

## 2016-02-14 MED ORDER — SODIUM HYALURONATE 23 MG/ML IO SOLN
INTRAOCULAR | Status: DC | PRN
Start: 2016-02-14 — End: 2016-02-14
  Administered 2016-02-14: 0.6 mL via INTRAOCULAR

## 2016-02-14 MED ORDER — ARMC OPHTHALMIC DILATING GEL
1.0000 "application " | OPHTHALMIC | Status: DC | PRN
  Administered 2016-02-14 (×2): 1 via OPHTHALMIC

## 2016-02-14 MED ORDER — TETRACAINE HCL 0.5 % OP SOLN
OPHTHALMIC | Status: AC
  Administered 2016-02-14: 1 [drp] via OPHTHALMIC
  Filled 2016-02-14: qty 2

## 2016-02-14 MED ORDER — EPINEPHRINE HCL 1 MG/ML IJ SOLN
INTRAOCULAR | Status: DC | PRN
  Administered 2016-02-14: 1 mL via OPHTHALMIC

## 2016-02-14 MED ORDER — ARMC OPHTHALMIC DILATING GEL
OPHTHALMIC | Status: AC
  Administered 2016-02-14: 1 via OPHTHALMIC
  Filled 2016-02-14: qty 0.25

## 2016-02-14 MED ORDER — TRIAMCINOLONE ACETONIDE 40 MG/ML IJ SUSP
INTRAMUSCULAR | Status: AC
  Filled 2016-02-14: qty 1

## 2016-02-14 MED ORDER — POVIDONE-IODINE 5 % OP SOLN
OPHTHALMIC | Status: AC
Start: 2016-02-14 — End: 2016-02-14
  Administered 2016-02-14: 1 via OPHTHALMIC
  Filled 2016-02-14: qty 30

## 2016-02-14 MED ORDER — MOXIFLOXACIN HCL 0.5 % OP SOLN: 1.0000 [drp] | OPHTHALMIC | Status: DC | PRN

## 2016-02-14 MED ORDER — TETRACAINE HCL 0.5 % OP SOLN
1.0000 [drp] | Freq: Once | OPHTHALMIC | Status: AC
  Administered 2016-02-14: 1 [drp] via OPHTHALMIC

## 2016-02-14 MED ORDER — MIDAZOLAM HCL 2 MG/2ML IJ SOLN
INTRAMUSCULAR | Status: DC | PRN
  Administered 2016-02-14 (×3): 1 mg via INTRAVENOUS
  Administered 2016-02-14: 2 mg via INTRAVENOUS
  Administered 2016-02-14 (×5): 1 mg via INTRAVENOUS

## 2016-02-14 MED ORDER — NA CHONDROIT SULF-NA HYALURON 40-30 MG/ML IO SOLN
INTRAOCULAR | Status: AC
  Filled 2016-02-14: qty 0.5

## 2016-02-14 MED ORDER — TRIAMCINOLONE ACETONIDE 40 MG/ML IJ SUSP
INTRAMUSCULAR | Status: DC | PRN
  Administered 2016-02-14: 40 mg via INTRAMUSCULAR

## 2016-02-14 MED ORDER — MOXIFLOXACIN HCL 0.5 % OP SOLN
OPHTHALMIC | Status: AC
  Filled 2016-02-14: qty 3

## 2016-02-14 MED ORDER — LIDOCAINE HCL (PF) 4 % IJ SOLN
INTRAMUSCULAR | Status: AC
  Filled 2016-02-14: qty 5

## 2016-02-14 MED ORDER — SODIUM HYALURONATE 10 MG/ML IO SOLN
INTRAOCULAR | Status: DC | PRN
  Administered 2016-02-14: 0.85 mL via INTRAOCULAR

## 2016-02-14 MED ORDER — SODIUM HYALURONATE 10 MG/ML IO SOLN
INTRAOCULAR | Status: AC
  Filled 2016-02-14: qty 0.85

## 2016-02-14 MED ORDER — POVIDONE-IODINE 5 % OP SOLN
1.0000 "application " | Freq: Once | OPHTHALMIC | Status: AC
  Administered 2016-02-14: 1 via OPHTHALMIC

## 2016-02-14 MED ORDER — SODIUM CHLORIDE 0.9 % IV SOLN
INTRAVENOUS | Status: DC
  Administered 2016-02-14: 10:00:00 via INTRAVENOUS

## 2016-02-14 SURGICAL SUPPLY — 26 items
CANNULA ANT/CHMB 27G (MISCELLANEOUS) ×2 IMPLANT
CANNULA ANT/CHMB 27GA (MISCELLANEOUS) ×6 IMPLANT
CUP MEDICINE 2OZ PLAST GRAD ST (MISCELLANEOUS) ×3 IMPLANT
GLOVE BIO SURGEON STRL SZ8 (GLOVE) ×3 IMPLANT
GLOVE BIOGEL M 6.5 STRL (GLOVE) ×3 IMPLANT
GLOVE SURG LX 7.5 STRW (GLOVE) ×2
GLOVE SURG LX STRL 7.5 STRW (GLOVE) ×1 IMPLANT
GOWN STRL REUS W/ TWL LRG LVL3 (GOWN DISPOSABLE) ×2 IMPLANT
GOWN STRL REUS W/TWL LRG LVL3 (GOWN DISPOSABLE) ×6
LENS IOL ACRSF IQ PC 24.5 (Intraocular Lens) IMPLANT
LENS IOL ACRYSOF IQ POST 24.5 (Intraocular Lens) ×3 IMPLANT
PACK CATARACT (MISCELLANEOUS) ×3 IMPLANT
PACK CATARACT BRASINGTON LX (MISCELLANEOUS) ×3 IMPLANT
PACK EYE AFTER SURG (MISCELLANEOUS) ×3 IMPLANT
PACK VIT ANT 23G (MISCELLANEOUS) ×2 IMPLANT
RING CAPSULAR TYPE 14 UNLOAD (Ring) ×2 IMPLANT
SOL BSS BAG (MISCELLANEOUS) ×3
SOL PREP PVP 2OZ (MISCELLANEOUS) ×3
SOLUTION BSS BAG (MISCELLANEOUS) ×1 IMPLANT
SOLUTION PREP PVP 2OZ (MISCELLANEOUS) ×1 IMPLANT
SUT ETHILON 10 0 CS140 6 (SUTURE) ×2 IMPLANT
SYR 3ML LL SCALE MARK (SYRINGE) ×8 IMPLANT
SYR 5ML LL (SYRINGE) ×3 IMPLANT
SYR TB 1ML 27GX1/2 LL (SYRINGE) ×3 IMPLANT
WATER STERILE IRR 250ML POUR (IV SOLUTION) ×3 IMPLANT
WIPE NON LINTING 3.25X3.25 (MISCELLANEOUS) ×3 IMPLANT

## 2016-02-14 NOTE — Anesthesia Preprocedure Evaluation (Signed)
Anesthesia Evaluation  Patient identified by MRN, date of birth, ID band Patient awake    Reviewed: Allergy & Precautions, NPO status , Patient's Chart, lab work & pertinent test results  History of Anesthesia Complications Negative for: history of anesthetic complications  Airway Mallampati: II       Dental   Pulmonary neg pulmonary ROS,           Cardiovascular hypertension, Pt. on medications and Pt. on home beta blockers + CAD, + Past MI and + CABG       Neuro/Psych Anxiety Panic attacks   GI/Hepatic Neg liver ROS, GERD  Medicated and Poorly Controlled,  Endo/Other  negative endocrine ROS  Renal/GU negative Renal ROS     Musculoskeletal   Abdominal   Peds  Hematology negative hematology ROS (+)   Anesthesia Other Findings   Reproductive/Obstetrics                            Anesthesia Physical Anesthesia Plan  ASA: III  Anesthesia Plan: MAC   Post-op Pain Management:    Induction: Intravenous  Airway Management Planned:   Additional Equipment:   Intra-op Plan:   Post-operative Plan:   Informed Consent: I have reviewed the patients History and Physical, chart, labs and discussed the procedure including the risks, benefits and alternatives for the proposed anesthesia with the patient or authorized representative who has indicated his/her understanding and acceptance.     Plan Discussed with:   Anesthesia Plan Comments:         Anesthesia Quick Evaluation

## 2016-02-14 NOTE — Anesthesia Postprocedure Evaluation (Signed)
Anesthesia Post Note  Patient: Bryan Leonard  Procedure(s) Performed: Procedure(s) (LRB): CATARACT EXTRACTION PHACO AND INTRAOCULAR LENS PLACEMENT (IOC) (Left) ANTERIOR VITRECTOMY (Left)  Patient location during evaluation: PACU Anesthesia Type: MAC Level of consciousness: awake and alert Pain management: pain level controlled Vital Signs Assessment: post-procedure vital signs reviewed and stable Respiratory status: spontaneous breathing Cardiovascular status: blood pressure returned to baseline Postop Assessment: no headache and no backache Anesthetic complications: no    Last Vitals:  Vitals:   02/14/16 0953 02/14/16 1159  BP: (!) 192/89 (!) 149/80  Pulse: 64   Resp: 16 16  Temp: 36.4 C 36.6 C    Last Pain:  Vitals:   02/14/16 0953  TempSrc: Oral  PainSc: 2                  [name] C

## 2016-02-14 NOTE — H&P (Signed)
The History and Physical notes are on paper, have been signed, and are to be scanned. The patient remains stable and unchanged from the H&P.   Previous H&P reviewed, patient examined, and there are no changes.  Bryan Leonard 02/14/2016 10:12 AM

## 2016-02-14 NOTE — Discharge Instructions (Signed)
Eye Surgery Discharge Instructions  Expect mild scratchy sensation or mild soreness. DO NOT RUB YOUR EYE!  The day of surgery:  Minimal physical activity, but bed rest is not required  No reading, computer work, or close hand work  No bending, lifting, or straining.  May watch TV  For 24 hours:  No driving, legal decisions, or alcoholic beverages  Safety precautions  Eat anything you prefer: It is better to start with liquids, then soup then solid foods.  _____ Eye patch should be worn until postoperative exam tomorrow.  ____ Solar shield eyeglasses should be worn for comfort in the sunlight/patch while sleeping  Resume all regular medications including aspirin or Coumadin if these were discontinued prior to surgery. You may shower, bathe, shave, or wash your hair. Tylenol may be taken for mild discomfort.  Call your doctor if you experience significant pain, nausea, or vomiting, fever > 101 or other signs of infection. [phone] or [phone] Specific instructions:  Follow-up Information    Willey BladeBradley King, MD .   Specialty:  Ophthalmology Why:  September 22 at 10:05am Contact information: [address] [place] [address] [phone]

## 2016-02-14 NOTE — Transfer of Care (Signed)
Immediate Anesthesia Transfer of Care Note  Patient: Werner LeanDennis W Galas  Procedure(s) Performed: Procedure(s) with comments: CATARACT EXTRACTION PHACO AND INTRAOCULAR LENS PLACEMENT (IOC) (Left) - US 59.8 AP% 7.5 CDE 4.51 Fluid pack lot # [id number] ANTERIOR VITRECTOMY (Left)  Patient Location: PACU  Anesthesia Type:MAC  Level of Consciousness: awake, alert  and oriented  Airway & Oxygen Therapy: Patient Spontanous Breathing  Post-op Assessment: Report given to RN and Post -op Vital signs reviewed and stable  Post vital signs: Reviewed and stable  Last Vitals:  Vitals:   02/14/16 0953 02/14/16 1159  BP: (!) 192/89 (!) 149/80  Pulse: 64   Resp: 16 16  Temp: 36.4 C 36.6 C    Last Pain:  Vitals:   02/14/16 0953  TempSrc: Oral  PainSc: 2          Complications: No apparent anesthesia complications

## 2016-02-14 NOTE — Op Note (Signed)
OPERATIVE NOTE  Bryan Leonard [id number] 02/14/2016   PREOPERATIVE DIAGNOSIS:  Nuclear sclerotic cataract left eye.  H25.12   POSTOPERATIVE DIAGNOSIS:     1.  Nuclear sclerotic cataract left eye.  2.  Zonular dehiscence, left eye.    PROCEDURE:   1.  Phacoemusification with posterior chamber intraocular lens placement of the left eye  2.  Placement of capsular tension ring, left eye.   LENS:   Implant Name Type Inv. Item Serial No. Manufacturer Lot No. LRB No. Used  IMPLANT LENS - [id number] Intraocular Lens IMPLANT LENS [id number] ALCON  Left 1  RING CAPSULAR TYPE 14 - [id number] Ring RING CAPSULAR TYPE 14   [employer]   Left 1       SN60WF 24.5   ULTRASOUND TIME: 0 minutes 59 seconds.  CDE 4.51   SURGEON:  Willey BladeBradley Jaleyah Longhi, MD, MPH   ANESTHESIA:  Topical with tetracaine drops and 2% Xylocaine jelly, augmented with 1% preservative-free intracameral lidocaine.   COMPLICATIONS:  Zonular dehiscence, left eye temporally 2:00 - 5:00.   DESCRIPTION OF PROCEDURE:  The patient was identified in the holding room and transported to the operating room and placed in the supine position under the operating microscope.  The left eye was identified as the operative eye and it was prepped and draped in the usual sterile ophthalmic fashion.   A 1.0 millimeter clear-corneal paracentesis was made at the 5:00 position. 0.5 ml of preservative-free 1% lidocaine with epinephrine was injected into the anterior chamber.  The anterior chamber was filled with Healon 5 viscoelastic.  A 2.4 millimeter keratome was used to make a near-clear corneal incision at the 2:00 position.  A curvilinear capsulorrhexis was made with a cystotome and capsulorrhexis forceps.  Balanced salt solution was used to hydrodissect and hydrodelineate the nucleus.   Phacoemulsification was then used in stop and chop fashion to remove the lens nucleus and epinucleus.  The remaining cortex was then removed using the irrigation  and aspiration handpiece. Healon was then placed into the capsular bag to distend it for lens placement.  A lens was then injected into the capsular bag.   Viscoelastic was aspirated.  The lens-capsule complex was tilted posteriorly on the temporal side and the patient complained of pain temporally in the left eye.  A zonular dehiscence was noted temporally.  It is not clear when this occurred, possibly during lens insertion.  Viscoat was injected.  A capsular tension ring was injected and the lens centered nicely.  A wexcel dabbed at the temporal wound identified vitreous to the wound.  A limited anterior vitrectomy was performed but on recheck there was still vitreous at the wound.  A 10-0 nylon suture was placed through the temporal wound, a small amount of kenalog was injected into the eye to tag the vitreous.  An iris sweep was used to free the vitreous from the wound.  A second paracentesis was created superiorly and additional vitrectomy was performed.   Wounds were hydrated with balanced salt solution.  The anterior chamber was inflated to a physiologic pressure with balanced salt solution.  Intracameral vigamox 0.1 mL undiltued was injected into the eye.  No wound leaks were noted.  Topical Vigamox drops were applied to the eye.  The patient was taken to the recovery room in stable condition.  Willey BladeBradley Dorine Duffey 02/14/2016, 11:59 AM

## 2016-03-06 ENCOUNTER — Other Ambulatory Visit: Payer: Self-pay | Admitting: Internal Medicine

## 2016-03-06 DIAGNOSIS — H539 Unspecified visual disturbance: Secondary | ICD-10-CM

## 2016-03-06 DIAGNOSIS — I69398 Other sequelae of cerebral infarction: Secondary | ICD-10-CM

## 2016-03-06 DIAGNOSIS — G450 Vertebro-basilar artery syndrome: Secondary | ICD-10-CM

## 2016-03-06 DIAGNOSIS — I639 Cerebral infarction, unspecified: Secondary | ICD-10-CM

## 2016-03-06 DIAGNOSIS — R42 Dizziness and giddiness: Secondary | ICD-10-CM

## 2016-03-19 ENCOUNTER — Ambulatory Visit
Admission: RE | Admit: 2016-03-19 | Discharge: 2016-03-19 | Disposition: A | Discharge status: Home / Self Care | Payer: Medicare Other | Source: Ambulatory Visit | Attending: Internal Medicine | Admitting: Internal Medicine

## 2016-03-19 DIAGNOSIS — I639 Cerebral infarction, unspecified: Secondary | ICD-10-CM

## 2016-03-19 LAB — POCT I-STAT CREATININE: Creatinine, Ser: 0.8 mg/dL (ref 0.61–1.24)

## 2016-03-19 MED ORDER — GADOBENATE DIMEGLUMINE 529 MG/ML IV SOLN
20.0000 mL | Freq: Once | INTRAVENOUS | Status: AC | PRN
  Administered 2016-03-19: 19 mL via INTRAVENOUS

## 2016-03-24 ENCOUNTER — Other Ambulatory Visit: Payer: Self-pay | Admitting: Neurology

## 2016-03-24 DIAGNOSIS — I6381 Other cerebral infarction due to occlusion or stenosis of small artery: Secondary | ICD-10-CM

## 2016-04-15 ENCOUNTER — Ambulatory Visit
Admission: RE | Admit: 2016-04-15 | Discharge: 2016-04-15 | Disposition: A | Discharge status: Home / Self Care | Payer: Medicare Other | Source: Ambulatory Visit | Attending: Neurology | Admitting: Neurology

## 2016-04-15 DIAGNOSIS — I639 Cerebral infarction, unspecified: Secondary | ICD-10-CM | POA: Insufficient documentation

## 2016-04-15 DIAGNOSIS — I6381 Other cerebral infarction due to occlusion or stenosis of small artery: Secondary | ICD-10-CM

## 2016-04-15 MED ORDER — GADOBENATE DIMEGLUMINE 529 MG/ML IV SOLN
20.0000 mL | Freq: Once | INTRAVENOUS | Status: AC | PRN
  Administered 2016-04-15: 20 mL via INTRAVENOUS

## 2017-05-05 ENCOUNTER — Encounter: Admission: RE | Payer: Self-pay | Source: Ambulatory Visit

## 2017-05-05 ENCOUNTER — Ambulatory Visit: Admission: RE | Admit: 2017-05-05 | Payer: Medicare Other | Source: Ambulatory Visit | Admitting: Gastroenterology

## 2017-05-05 SURGERY — ESOPHAGOGASTRODUODENOSCOPY (EGD) WITH PROPOFOL
Anesthesia: General

## 2017-07-14 ENCOUNTER — Encounter
Admission: RE | Disposition: A | Discharge status: Home / Self Care | Payer: Self-pay | Source: Ambulatory Visit | Attending: Gastroenterology

## 2017-07-14 ENCOUNTER — Encounter: Payer: Self-pay | Admitting: Certified Registered Nurse Anesthetist

## 2017-07-14 ENCOUNTER — Ambulatory Visit
Admission: RE | Admit: 2017-07-14 | Discharge: 2017-07-14 | Disposition: A | Discharge status: Home / Self Care | Payer: Medicare Other | Source: Ambulatory Visit | Attending: Gastroenterology | Admitting: Gastroenterology

## 2017-07-14 ENCOUNTER — Ambulatory Visit: Payer: Medicare Other | Admitting: Anesthesiology

## 2017-07-14 DIAGNOSIS — K5730 Diverticulosis of large intestine without perforation or abscess without bleeding: Secondary | ICD-10-CM | POA: Diagnosis not present

## 2017-07-14 DIAGNOSIS — Z888 Allergy status to other drugs, medicaments and biological substances status: Secondary | ICD-10-CM | POA: Diagnosis not present

## 2017-07-14 DIAGNOSIS — R918 Other nonspecific abnormal finding of lung field: Secondary | ICD-10-CM | POA: Diagnosis not present

## 2017-07-14 DIAGNOSIS — Z79899 Other long term (current) drug therapy: Secondary | ICD-10-CM | POA: Insufficient documentation

## 2017-07-14 DIAGNOSIS — Z1211 Encounter for screening for malignant neoplasm of colon: Secondary | ICD-10-CM | POA: Diagnosis present

## 2017-07-14 DIAGNOSIS — M109 Gout, unspecified: Secondary | ICD-10-CM | POA: Diagnosis not present

## 2017-07-14 DIAGNOSIS — K640 First degree hemorrhoids: Secondary | ICD-10-CM | POA: Diagnosis not present

## 2017-07-14 DIAGNOSIS — R05 Cough: Secondary | ICD-10-CM | POA: Insufficient documentation

## 2017-07-14 DIAGNOSIS — Z7902 Long term (current) use of antithrombotics/antiplatelets: Secondary | ICD-10-CM | POA: Diagnosis not present

## 2017-07-14 DIAGNOSIS — I252 Old myocardial infarction: Secondary | ICD-10-CM | POA: Insufficient documentation

## 2017-07-14 DIAGNOSIS — Z538 Procedure and treatment not carried out for other reasons: Secondary | ICD-10-CM | POA: Insufficient documentation

## 2017-07-14 DIAGNOSIS — Z7982 Long term (current) use of aspirin: Secondary | ICD-10-CM | POA: Insufficient documentation

## 2017-07-14 DIAGNOSIS — E785 Hyperlipidemia, unspecified: Secondary | ICD-10-CM | POA: Diagnosis not present

## 2017-07-14 DIAGNOSIS — Z86010 Personal history of colonic polyps: Secondary | ICD-10-CM | POA: Insufficient documentation

## 2017-07-14 DIAGNOSIS — R1013 Epigastric pain: Secondary | ICD-10-CM | POA: Diagnosis not present

## 2017-07-14 DIAGNOSIS — I2510 Atherosclerotic heart disease of native coronary artery without angina pectoris: Secondary | ICD-10-CM | POA: Insufficient documentation

## 2017-07-14 DIAGNOSIS — I10 Essential (primary) hypertension: Secondary | ICD-10-CM | POA: Diagnosis not present

## 2017-07-14 DIAGNOSIS — K219 Gastro-esophageal reflux disease without esophagitis: Secondary | ICD-10-CM | POA: Diagnosis not present

## 2017-07-14 HISTORY — PX: COLONOSCOPY WITH PROPOFOL: SHX5780

## 2017-07-14 HISTORY — PX: ESOPHAGOGASTRODUODENOSCOPY (EGD) WITH PROPOFOL: SHX5813

## 2017-07-14 SURGERY — COLONOSCOPY WITH PROPOFOL
Anesthesia: General

## 2017-07-14 MED ORDER — SODIUM CHLORIDE 0.9 % IV SOLN: INTRAVENOUS | Status: DC

## 2017-07-14 MED ORDER — LIDOCAINE HCL (CARDIAC) 20 MG/ML IV SOLN
INTRAVENOUS | Status: DC | PRN
  Administered 2017-07-14 (×2): 100 mg via INTRAVENOUS

## 2017-07-14 MED ORDER — PROPOFOL 500 MG/50ML IV EMUL
INTRAVENOUS | Status: DC | PRN
  Administered 2017-07-14: 100 ug/kg/min via INTRAVENOUS

## 2017-07-14 MED ORDER — SODIUM CHLORIDE 0.9 % IV SOLN
INTRAVENOUS | Status: DC
  Administered 2017-07-14: 13:00:00 via INTRAVENOUS

## 2017-07-14 MED ORDER — LIDOCAINE HCL (PF) 1 % IJ SOLN: 2.0000 mL | Freq: Once | INTRAMUSCULAR | Status: DC

## 2017-07-14 MED ORDER — PROPOFOL 500 MG/50ML IV EMUL
INTRAVENOUS | Status: AC
Start: 2017-07-14 — End: 2017-07-14
  Filled 2017-07-14: qty 50

## 2017-07-14 MED ORDER — LIDOCAINE HCL (PF) 2 % IJ SOLN
INTRAMUSCULAR | Status: AC
  Filled 2017-07-14: qty 10

## 2017-07-14 MED ORDER — LIDOCAINE HCL (PF) 1 % IJ SOLN
INTRAMUSCULAR | Status: AC
  Filled 2017-07-14: qty 2

## 2017-07-14 MED ORDER — PROPOFOL 10 MG/ML IV BOLUS
INTRAVENOUS | Status: DC | PRN
  Administered 2017-07-14: 80 mg via INTRAVENOUS

## 2017-07-14 NOTE — Anesthesia Preprocedure Evaluation (Signed)
Anesthesia Evaluation  Patient identified by MRN, date of birth, ID band Patient awake    Reviewed: Allergy & Precautions, H&P , NPO status , Patient's Chart, lab work & pertinent test results, reviewed documented beta blocker date and time   Airway Mallampati: III  TM Distance: <3 FB Neck ROM: full    Dental  (+) Poor Dentition   Pulmonary neg pulmonary ROS,    Pulmonary exam normal        Cardiovascular Exercise Tolerance: Good hypertension, + CAD and + Past MI  negative cardio ROS Normal cardiovascular exam Rhythm:regular Rate:Normal     Neuro/Psych negative neurological ROS  negative psych ROS   GI/Hepatic negative GI ROS, Neg liver ROS, GERD  Medicated,  Endo/Other  negative endocrine ROS  Renal/GU negative Renal ROS  negative genitourinary   Musculoskeletal   Abdominal   Peds  Hematology negative hematology ROS (+)   Anesthesia Other Findings Past Medical History: No date: ASCVD (arteriosclerotic cardiovascular disease) No date: Coronary artery disease No date: GERD (gastroesophageal reflux disease) No date: Gout No date: HOH (hard of hearing) No date: Hyperlipidemia No date: Hypertension No date: Myocardial infarction No date: Pulmonary nodules No date: Vertigo Past Surgical History: 02/14/2016: ANTERIOR VITRECTOMY; Left     Comment:  Procedure: ANTERIOR VITRECTOMY;  Surgeon: Nevada CraneBradley Mark               King, MD;  Location: [facility] ORS;  Service: Ophthalmology;                Laterality: Left; No date: APPENDECTOMY No date: CARDIAC CATHETERIZATION     Comment:  stent 01/24/2016: CATARACT EXTRACTION W/PHACO; Right     Comment:  Procedure: CATARACT EXTRACTION PHACO AND INTRAOCULAR               LENS PLACEMENT (IOC);  Surgeon: Nevada CraneBradley Mark King, MD;                Location: [facility] ORS;  Service: Ophthalmology;  Laterality:              Right;  US 58.1 AP% 6.8 CDE 3.94 Fluid pack lot #   [id number] H 02/14/2016: CATARACT EXTRACTION W/PHACO; Left     Comment:  Procedure: CATARACT EXTRACTION PHACO AND INTRAOCULAR               LENS PLACEMENT (IOC);  Surgeon: Nevada CraneBradley Mark King, MD;                Location: [facility] ORS;  Service: Ophthalmology;  Laterality:              Left;  US 59.8 AP% 7.5 CDE 4.51 Fluid pack lot #               [id number] No date: CHOLECYSTECTOMY No date: CORONARY ARTERY BYPASS GRAFT 02/05/2015: ESOPHAGOGASTRODUODENOSCOPY (EGD) WITH PROPOFOL; N/A     Comment:  Procedure: ESOPHAGOGASTRODUODENOSCOPY (EGD) WITH               PROPOFOL;  Surgeon: Christena DeemMartin U Skulskie, MD;  [facility]               [facility];  Service: Endoscopy;  Laterality: N/A; No date: neck fusion No date: pseudoaneursym; Right     Comment:  right groin BMI    Body Mass Index:  31.01 kg/m     Reproductive/Obstetrics negative OB ROS  Anesthesia Physical Anesthesia Plan  ASA: III  Anesthesia Plan: General   Post-op Pain Management:    Induction:   PONV Risk Score and Plan:   Airway Management Planned:   Additional Equipment:   Intra-op Plan:   Post-operative Plan:   Informed Consent: I have reviewed the patients History and Physical, chart, labs and discussed the procedure including the risks, benefits and alternatives for the proposed anesthesia with the patient or authorized representative who has indicated his/her understanding and acceptance.   Dental Advisory Given  Plan Discussed with: CRNA  Anesthesia Plan Comments:         Anesthesia Quick Evaluation

## 2017-07-14 NOTE — Brief Op Note (Addendum)
EGD aborted due to coughing.

## 2017-07-14 NOTE — Op Note (Signed)
[facility] Gastroenterology Patient Name: Bryan CarbonDennis Haslip Procedure Date: 07/14/2017 1:27 PM MRN: [id number] Account #: [id number] Date of Birth: 06/27/1945 Admit Type: Outpatient Age: [id number] [facility] Gender: Male Note Status: Finalized Procedure:            Colonoscopy Indications:          Personal history of colonic polyps Providers:            Christena DeemMartin U. Cristyn Crossno, MD Referring MD:         Danella PentonMark F. Miller, MD (Referring MD) Medicines:            Monitored Anesthesia Care Complications:        No immediate complications. Procedure:            Pre-Anesthesia Assessment:                       - ASA Grade Assessment: III - A patient with severe                        systemic disease.                       After obtaining informed consent, the colonoscope was                        passed under direct vision. Throughout the procedure,                        the patient's blood pressure, pulse, and oxygen                        saturations were monitored continuously. The                        Colonoscope was introduced through the anus and                        advanced to the the cecum, identified by appendiceal                        orifice and ileocecal valve. The colonoscopy was                        unusually difficult due to significant looping and a                        tortuous colon. Successful completion of the procedure                        was aided by changing the patient to a supine position,                        changing the patient to a prone position, using manual                        pressure and withdrawing and reinserting the scope. The                        patient tolerated the procedure well. The quality of  the bowel preparation was good. Findings:      The colon (entire examined portion) appeared normal.      Non-bleeding internal hemorrhoids were found during retroflexion. The       hemorrhoids  were small and Grade I (internal hemorrhoids that do not       prolapse).      The digital rectal exam was normal.      Multiple small-mouthed diverticula were found in the sigmoid colon and       descending colon.      The digital rectal exam was normal. Impression:           - The entire examined colon is normal.                       - Non-bleeding internal hemorrhoids.                       - No specimens collected. Recommendation:       - Discharge patient to home.                       - Repeat colonoscopy in 5 years for surveillance. Procedure Code(s):    --- Professional ---                       [phone], Colonoscopy, flexible; diagnostic, including                        collection of specimen(s) by brushing or washing, when                        performed (separate procedure) Diagnosis Code(s):    --- Professional ---                       K64.0, First degree hemorrhoids                       Z86.010, Personal history of colonic polyps CPT copyright 2016 American Medical Association. All rights reserved. The codes documented in this report are preliminary and upon coder review may  be revised to meet current compliance requirements. Christena Deem, MD 07/14/2017 2:19:46 PM This report has been signed electronically. Number of Addenda: 0 Note Initiated On: 07/14/2017 1:27 PM Scope Withdrawal Time: 0 hours 9 minutes 12 seconds  Total Procedure Duration: 0 hours 30 minutes 40 seconds       [facility]

## 2017-07-14 NOTE — Anesthesia Post-op Follow-up Note (Signed)
Anesthesia QCDR form completed.        

## 2017-07-14 NOTE — H&P (Signed)
Outpatient short stay form Pre-procedure 07/14/2017 1:24 PM Christena DeemMartin U Sybella Harnish MD  Primary Physician: Dr. Bethann PunchesMark Miller  Reason for visit: EGD and colonoscopy  History of present illness: Patient is a 72 year old male presenting today with complaint of some epigastric discomfort that will at times radiate into the right upper quadrant.  He has had his gallbladder out many years ago.  Right upper quadrant discomfort.  He does take a proton pump inhibitor regularly.  He is also presenting for colonoscopy due to personal history of adenomatous colon polyps.  He tolerated his prep well.  He has been off of his Plavix for 6 days.  He is also held his 81 mg aspirin.  Takes no other aspirin products or blood thinning agent.    Current Facility-Administered Medications:  .  lidocaine (PF) (XYLOCAINE) 1 % injection, , , ,  .  0.9 %  sodium chloride infusion, , Intravenous, Continuous, Rehaan Viloria U, MD .  0.9 %  sodium chloride infusion, , Intravenous, Continuous, Azizah Lisle U, MD .  lidocaine (PF) (XYLOCAINE) 1 % injection 2 mL, 2 mL, Intradermal, Once, Christena DeemSkulskie, Kaydi Kley U, MD  Medications Prior to Admission  Medication Sig Dispense Refill Last Dose  . ALPRAZolam (XANAX) 0.5 MG tablet Take 0.5 mg by mouth at bedtime as needed for anxiety.   02/13/2016 at Unknown time  . amLODipine (NORVASC) 5 MG tablet Take 5 mg by mouth daily. At supper.   02/13/2016 at Unknown time  . aspirin EC 81 MG tablet Take 81 mg by mouth daily.   07/13/2017  . clopidogrel (PLAVIX) 75 MG tablet Take 75 mg by mouth daily.   07/09/2017  . colchicine 0.6 MG tablet Take 0.6 mg by mouth 3 (three) times a week. Mondays, Wednesdays and Fridays.   02/13/2016 at Unknown time  . diazepam (VALIUM) 5 MG tablet Take 5 mg by mouth as needed for anxiety.   Past Month at Unknown time  . Difluprednate (DUREZOL) 0.05 % EMUL Apply to eye.   02/14/2016 at Unknown time  . ibuprofen (ADVIL,MOTRIN) 200 MG tablet Take 200 mg by mouth every 6  (six) hours as needed.   02/13/2016 at Unknown time  . losartan (COZAAR) 100 MG tablet Take 50 mg by mouth 2 (two) times daily.    07/14/2017 at 0600  . magnesium 30 MG tablet Take 30 mg by mouth 2 (two) times daily.   02/14/2016 at 0800  . meclizine (ANTIVERT) 25 MG tablet Take 25 mg by mouth as needed for dizziness.   Past Month at Unknown time  . metoprolol tartrate (LOPRESSOR) 25 MG tablet Take 25 mg by mouth 2 (two) times daily.   07/14/2017 at 0600  . moxifloxacin (VIGAMOX) 0.5 % ophthalmic solution Place 1 drop into the left eye 3 (three) times daily.   Completed Course at Unknown time  . nitroGLYCERIN (NITROSTAT) 0.4 MG SL tablet Place 0.4 mg under the tongue every 5 (five) minutes as needed for chest pain.   years at Unknown time  . pantoprazole (PROTONIX) 40 MG tablet Take 40 mg by mouth at bedtime.    02/13/2016 at Unknown time  . ranitidine (ZANTAC) 300 MG tablet Take 300 mg by mouth as needed.    Past Month at Unknown time  . simvastatin (ZOCOR) 20 MG tablet Take 20 mg by mouth daily at 6 PM.   02/13/2016 at Unknown time  . vardenafil (LEVITRA) 10 MG tablet Take 10 mg by mouth as needed for erectile dysfunction.  Past Month at Unknown time     Allergies  Allergen Reactions  . Ace Inhibitors   . Calcium Channel Blockers Other (See Comments)    causes aches and pains.  Katrine Coho [Diltiazem Hcl]   . Prilosec Otc [Omeprazole Magnesium]   . Verapamil Hcl Er      Past Medical History:  Diagnosis Date  . ASCVD (arteriosclerotic cardiovascular disease)   . Coronary artery disease   . GERD (gastroesophageal reflux disease)   . Gout   . HOH (hard of hearing)   . Hyperlipidemia   . Hypertension   . Myocardial infarction   . Pulmonary nodules   . Vertigo     Review of systems:      Physical Exam    Heart and lungs: Regular rate and rhythm without rub or gallop, lungs are bilaterally clear.    HEENT: Normocephalic atraumatic eyes are anicteric    Other:    Pertinant exam  for procedure: Soft nontender nondistended bowel sounds positive normoactive    Planned proceedures: EGD and colonoscopy with indicated procedures. I have discussed the risks benefits and complications of procedures to include not limited to bleeding, infection, perforation and the risk of sedation and the patient wishes to proceed.    Christena Deem, MD Gastroenterology 07/14/2017  1:24 PM

## 2017-07-14 NOTE — Op Note (Signed)
[facility] Gastroenterology Patient Name: Bryan Leonard Procedure Date: 07/14/2017 1:28 PM MRN: [id number] Account #: [id number] Date of Birth: 09/19/1945 Admit Type: Outpatient Age: [age] [facility]OM 3 Gender: Male Note Status: Finalized Procedure:            Upper GI endoscopy Indications:          Epigastric abdominal pain, Dyspepsia Providers:            Christena DeemMartin U. Skulskie, MD Referring MD:         Danella PentonMark F. Miller, MD (Referring MD) Medicines:            Monitored Anesthesia Care Complications:        No immediate complications. Procedure:            Pre-Anesthesia Assessment:                       - ASA Grade Assessment: III - A patient with severe                        systemic disease.                       After obtaining informed consent, the endoscope was                        passed under direct vision. Throughout the procedure,                        the patient's blood pressure, pulse, and oxygen                        saturations were monitored continuously. The procedure                        was aborted. The scope was not inserted. Medications                        were given. The patient tolerated the procedure poorly. Findings:      I introduced the scope into the region of the hypopharynx at the       esophageal opening, with a notable amount of coughing and movement with       each effort. I deciede to hold the scope to after the colonoscopy. After       the colonoscopy, an attempt was made to reintroduce the scope again       causing coughing and inability for the position to be stable. I decided       to hold on the EGD for now, may need pulmonary prep before another       effort.      Aborted procedure. Impression:           - No specimens collected. Recommendation:       - Discharge patient to home.                       - Will discuss further with patient. Diagnosis Code(s):    --- Professional ---  R10.13, Epigastric pain Christena DeemMartin U Skulskie, MD 07/14/2017 2:32:36 PM This report has been signed electronically. Number of Addenda: 0 Note Initiated On: 07/14/2017 1:28 PM      [facility]

## 2017-07-14 NOTE — Transfer of Care (Addendum)
Immediate Anesthesia Transfer of Care Note  Patient: Bryan Leonard  Procedure(s) Performed: COLONOSCOPY WITH PROPOFOL (N/A ) ESOPHAGOGASTRODUODENOSCOPY (EGD) WITH PROPOFOL (N/A )  Patient Location: PACU  Anesthesia Type:MAC  Level of Consciousness: sedated  Airway & Oxygen Therapy: Patient Spontanous Breathing  Post-op Assessment: Report given to RN  Post vital signs: stable  Last Vitals:  Vitals:   07/14/17 1144  BP: (!) 160/92  Pulse: 83  Resp: 16  Temp: (!) 36.1 C  SpO2: 100%    Last Pain:  Vitals:   07/14/17 1144  TempSrc: Tympanic         Complications: No apparent anesthesia complications

## 2017-07-15 NOTE — Anesthesia Postprocedure Evaluation (Signed)
Anesthesia Post Note  Patient: Werner LeanDennis W Smucker  Procedure(s) Performed: COLONOSCOPY WITH PROPOFOL (N/A ) ESOPHAGOGASTRODUODENOSCOPY (EGD) WITH PROPOFOL (N/A )  Patient location during evaluation: PACU Anesthesia Type: General Level of consciousness: awake and alert Pain management: pain level controlled Vital Signs Assessment: post-procedure vital signs reviewed and stable Respiratory status: spontaneous breathing, nonlabored ventilation, respiratory function stable and patient connected to nasal cannula oxygen Cardiovascular status: blood pressure returned to baseline and stable Postop Assessment: no apparent nausea or vomiting Anesthetic complications: no     Last Vitals:  Vitals:   07/14/17 1455 07/14/17 1505  BP: 128/84 126/84  Pulse: 68 (!) 58  Resp: 16 16  Temp:    SpO2: 100% 99%    Last Pain:  Vitals:   07/15/17 0738  TempSrc:   PainSc: 0-No pain                 Yevette EdwardsJames G Ortencia Askari

## 2017-08-26 ENCOUNTER — Other Ambulatory Visit: Payer: Self-pay | Admitting: Gastroenterology

## 2017-08-26 DIAGNOSIS — R1013 Epigastric pain: Secondary | ICD-10-CM

## 2017-09-02 ENCOUNTER — Ambulatory Visit
Admission: RE | Admit: 2017-09-02 | Discharge: 2017-09-02 | Disposition: A | Discharge status: Home / Self Care | Payer: Medicare Other | Source: Ambulatory Visit | Attending: Gastroenterology | Admitting: Gastroenterology

## 2017-09-02 DIAGNOSIS — K219 Gastro-esophageal reflux disease without esophagitis: Secondary | ICD-10-CM | POA: Insufficient documentation

## 2017-09-02 DIAGNOSIS — R1013 Epigastric pain: Secondary | ICD-10-CM | POA: Diagnosis present

## 2017-10-21 ENCOUNTER — Other Ambulatory Visit: Payer: Self-pay | Admitting: Internal Medicine

## 2017-10-21 DIAGNOSIS — M5010 Cervical disc disorder with radiculopathy, unspecified cervical region: Secondary | ICD-10-CM

## 2017-10-23 ENCOUNTER — Ambulatory Visit
Admission: RE | Admit: 2017-10-23 | Discharge: 2017-10-23 | Disposition: A | Discharge status: Home / Self Care | Payer: Medicare Other | Source: Ambulatory Visit | Attending: Internal Medicine | Admitting: Internal Medicine

## 2017-10-23 ENCOUNTER — Encounter (INDEPENDENT_AMBULATORY_CARE_PROVIDER_SITE_OTHER): Payer: Self-pay

## 2017-10-23 DIAGNOSIS — M5010 Cervical disc disorder with radiculopathy, unspecified cervical region: Secondary | ICD-10-CM | POA: Diagnosis present

## 2017-10-23 DIAGNOSIS — Z981 Arthrodesis status: Secondary | ICD-10-CM | POA: Diagnosis not present

## 2017-10-23 DIAGNOSIS — M2578 Osteophyte, vertebrae: Secondary | ICD-10-CM | POA: Insufficient documentation

## 2017-10-23 DIAGNOSIS — M4802 Spinal stenosis, cervical region: Secondary | ICD-10-CM | POA: Diagnosis not present

## 2017-10-23 DIAGNOSIS — M5011 Cervical disc disorder with radiculopathy,  high cervical region: Secondary | ICD-10-CM | POA: Diagnosis not present

## 2017-12-01 ENCOUNTER — Other Ambulatory Visit: Payer: Self-pay | Admitting: Internal Medicine

## 2017-12-01 DIAGNOSIS — M5116 Intervertebral disc disorders with radiculopathy, lumbar region: Secondary | ICD-10-CM

## 2017-12-02 ENCOUNTER — Ambulatory Visit
Admission: RE | Admit: 2017-12-02 | Discharge: 2017-12-02 | Disposition: A | Discharge status: Home / Self Care | Payer: Medicare Other | Source: Ambulatory Visit | Attending: Internal Medicine | Admitting: Internal Medicine

## 2017-12-02 DIAGNOSIS — M5137 Other intervertebral disc degeneration, lumbosacral region: Secondary | ICD-10-CM | POA: Insufficient documentation

## 2017-12-02 DIAGNOSIS — M5116 Intervertebral disc disorders with radiculopathy, lumbar region: Secondary | ICD-10-CM | POA: Insufficient documentation

## 2017-12-02 DIAGNOSIS — M47816 Spondylosis without myelopathy or radiculopathy, lumbar region: Secondary | ICD-10-CM | POA: Insufficient documentation

## 2017-12-02 DIAGNOSIS — M5126 Other intervertebral disc displacement, lumbar region: Secondary | ICD-10-CM | POA: Insufficient documentation

## 2019-06-28 IMAGING — MR MR CERVICAL SPINE W/O CM
5 series · 34 of 48 positions shown · non-contrast
Comparison: Cervical spine MRI 01/13/2006

CLINICAL DATA: Cervical spondylosis with radiculopathy. Left-sided
pain. Cervical fusion 0331

EXAM:
MRI CERVICAL SPINE WITHOUT CONTRAST
TECHNIQUE: Multiplanar, multisequence MR imaging of the cervical spine was
performed. No intravenous contrast was administered.

[Series 2: T2 · sagittal · 3.0mm · 0.56mm/px · 7 of 13 slices shown (1 of 2)]
[im 1/13]
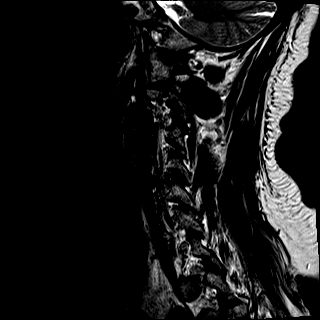
[im 3/13]
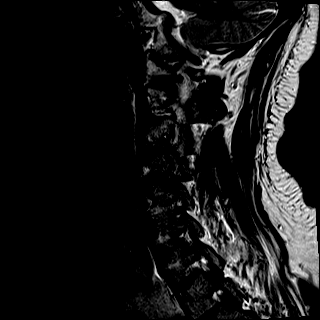
[im 5/13]
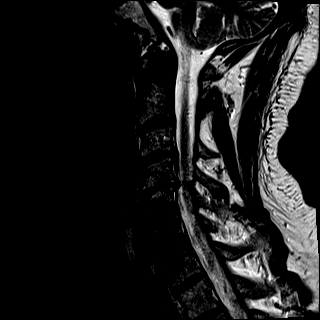
[im 7/13]
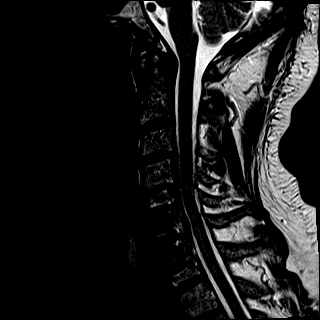
[im 9/13]
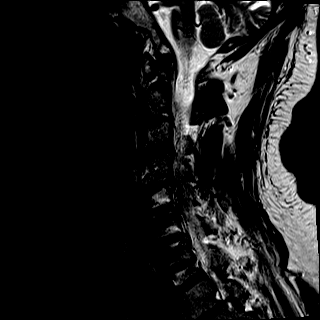
[im 11/13]
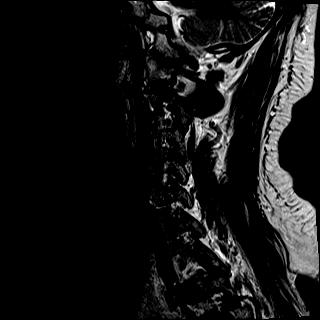
[im 13/13]
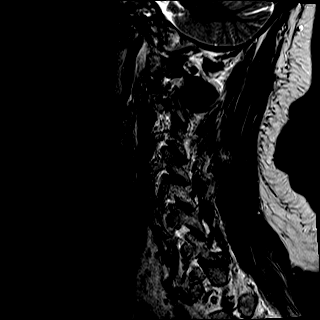

[Series 3: T1 · sagittal · 3.0mm · 0.70mm/px · 7 of 13 slices shown]
[im 1/13]
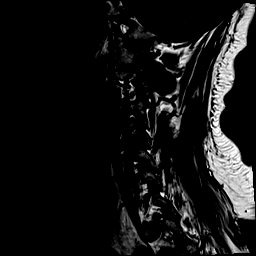
[im 3/13]
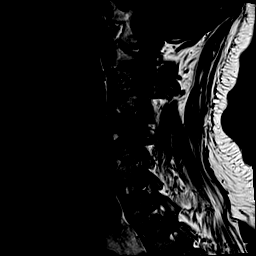
[im 5/13]
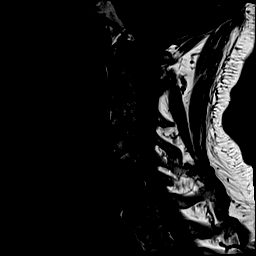
[im 7/13]
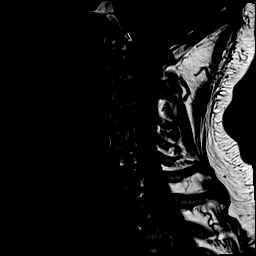
[im 9/13]
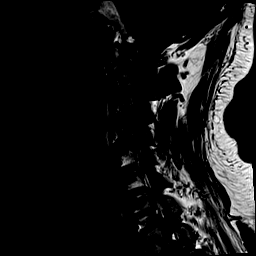
[im 11/13]
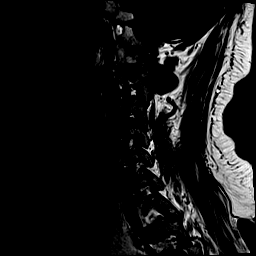
[im 13/13]
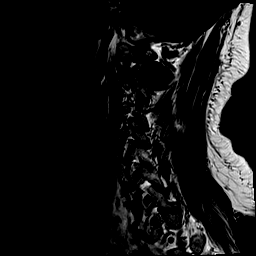

[Series 4: STIR · sagittal · 3.0mm · 0.35mm/px · 6 of 13 slices shown]
[im 1/13]
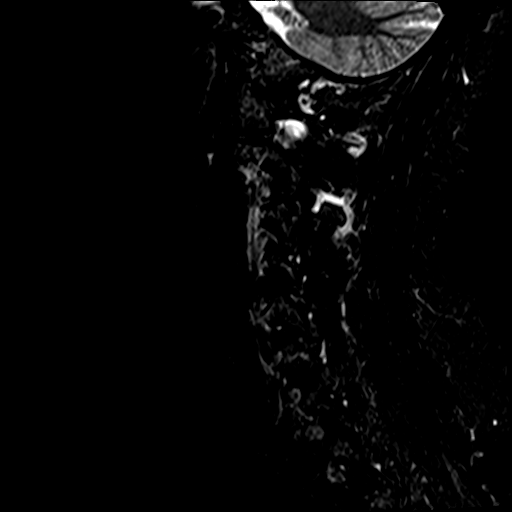
[im 3/13]
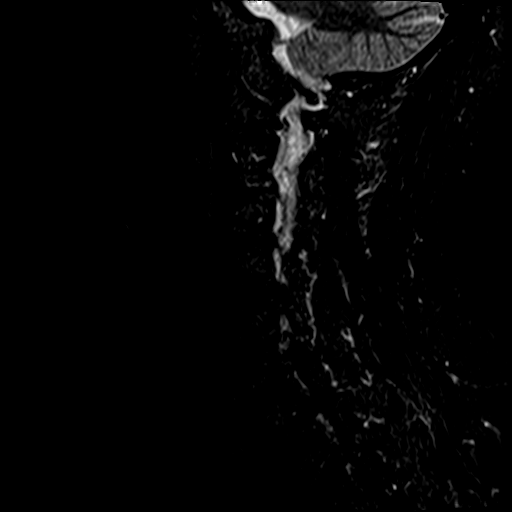
[im 5/13]
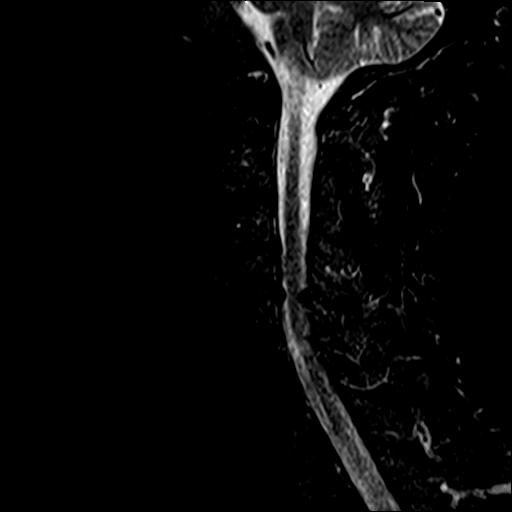
[im 8/13]
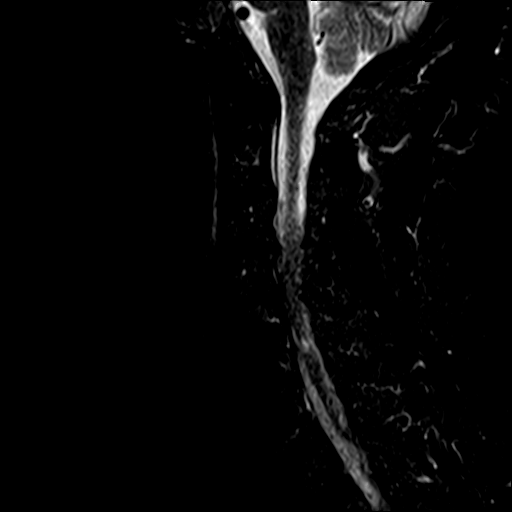
[im 10/13]
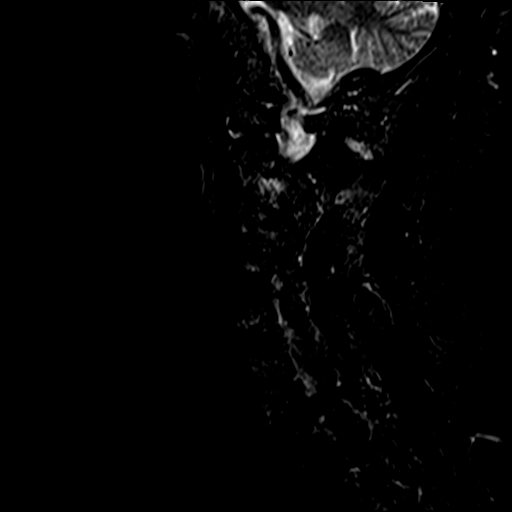
[im 13/13]
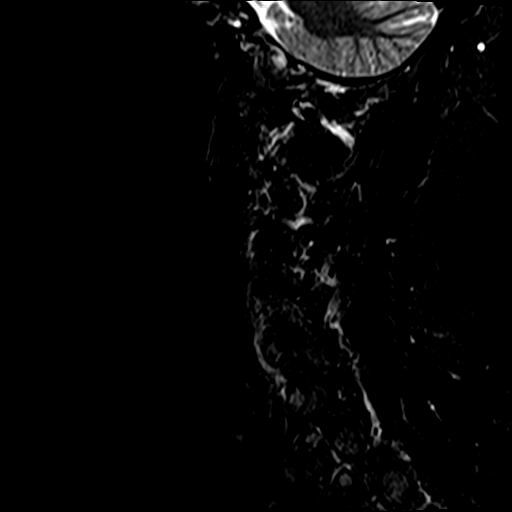

[Series 5: T2 · axial · 3.0mm · 0.62mm/px · z∈[-109,-3]mm · 8 of 29 slices shown (2 of 2)]
[im 1/29]
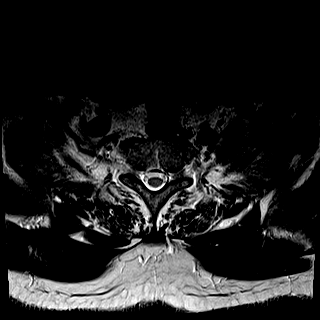
[im 5/29]
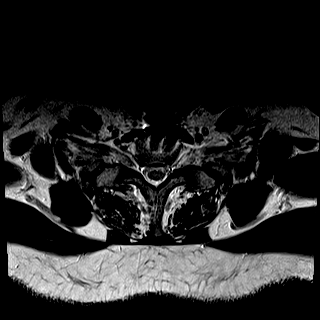
[im 9/29]
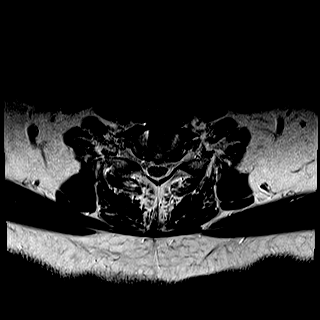
[im 13/29]
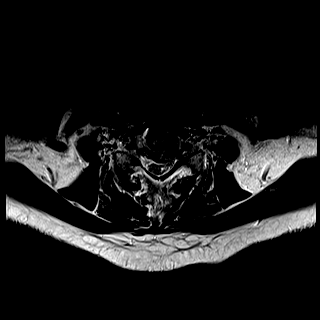
[im 16/29]
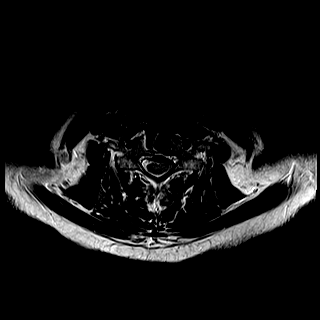
[im 20/29]
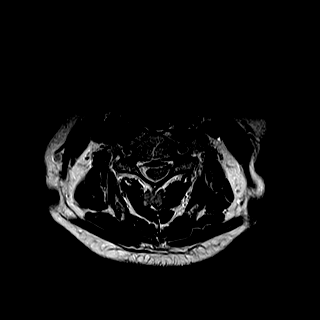
[im 24/29]
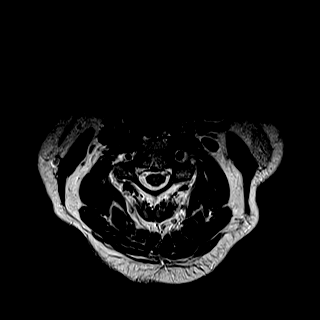
[im 29/29]
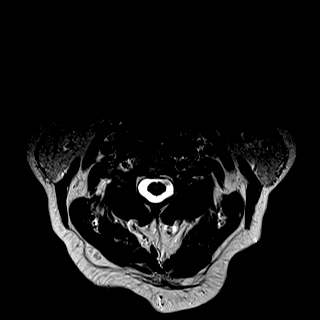

[Series 6: mpgr ax · axial · 3.0mm · 0.35mm/px · z∈[-101,-29]mm · 6 of 29 slices shown]
[im 1/29]
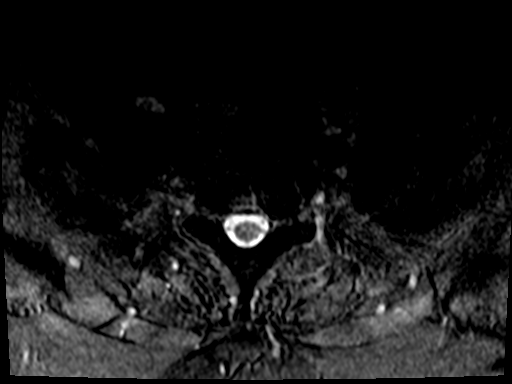
[im 5/29]
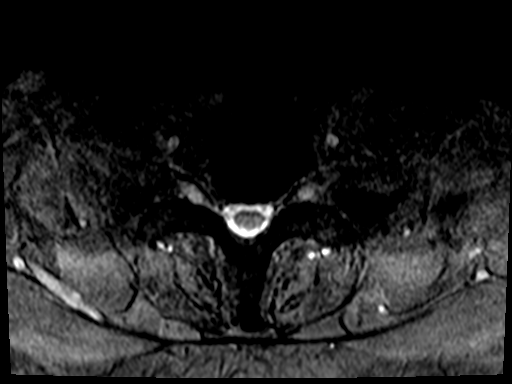
[im 9/29]
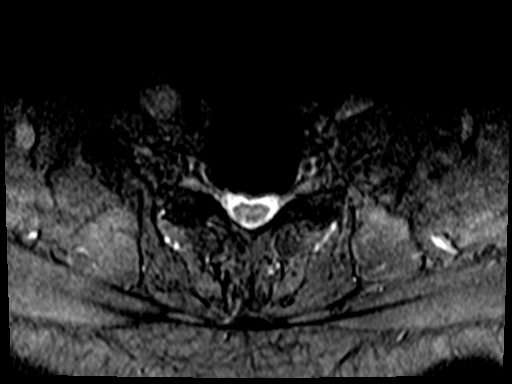
[im 13/29]
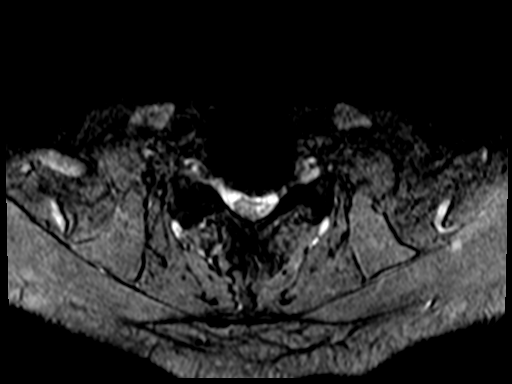
[im 16/29]
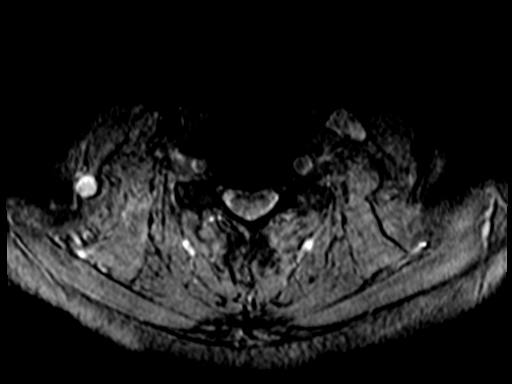
[im 20/29]
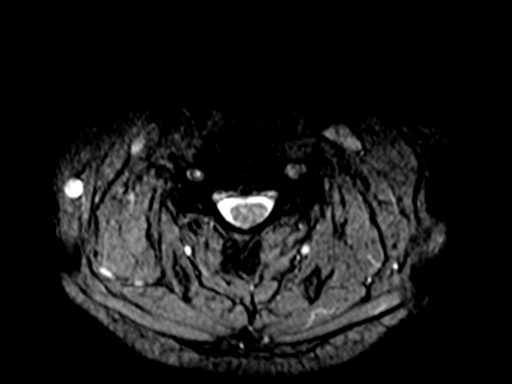

[34 of 48 positions shown; findings below may reference images not displayed]

FINDINGS: Alignment: Mild retrolisthesis C3-4 and C4-5.

Vertebrae: Interval ACDF C5 through C7. Negative for fracture or
mass

Cord: Focal cord hyperintensity on the left at C5-6 is unchanged
from the prior study. No new cord lesion.

Posterior Fossa, vertebral arteries, paraspinal tissues: Negative

Disc levels:

C2-3: Disc degeneration and uncinate spurring on the right without
significant stenosis

C3-4: Disc degeneration and spondylosis has progressed since the
prior study. Moderate foraminal encroachment. No cord deformity.

C4-5: Progressive disc degeneration and spondylosis. Mild
retrolisthesis. Cord flattening and mild spinal stenosis. Moderate
foraminal stenosis bilaterally has progressed in the interval.

C5-6: ACDF. Improvement in spinal stenosis. Improvement in central
and left-sided osteophyte with cord flattening on the left. Focal
cord hyperintensity on the left is unchanged. Mild foraminal
narrowing bilaterally.

C6-7: ACDF. Interval improvement in left-sided osteophyte. No
significant spinal or foraminal stenosis

C7-T1: Negative
IMPRESSION: Progressive disc degeneration and spurring at C3-4 and C4-5.
Progression of mild spinal stenosis and moderate foraminal stenosis
bilaterally at C4-5.

Interval ACDF C5-6 and C6-7 with improvement in spinal stenosis.
Improvement in central and left-sided osteophyte at C5-6. Focal cord
hyperintensity on the left at C5-6 is unchanged from the prior
study.
